# Patient Record
Sex: Male | Born: 1965 | Race: White | Hispanic: No | Marital: Married | Smoking: Never smoker
Health system: Southern US, Community
[De-identification: ages and names within clinical notes are randomized; demographics above are authoritative.]

## PROBLEM LIST (undated history)

## (undated) DIAGNOSIS — J069 Acute upper respiratory infection, unspecified: Secondary | ICD-10-CM

## (undated) DIAGNOSIS — J45901 Unspecified asthma with (acute) exacerbation: Secondary | ICD-10-CM

## (undated) HISTORY — DX: Unspecified asthma with (acute) exacerbation: J45.901

## (undated) HISTORY — DX: Acute upper respiratory infection, unspecified: J06.9

---

## 2007-11-27 ENCOUNTER — Emergency Department (HOSPITAL_COMMUNITY): Admission: EM | Admit: 2007-11-27 | Discharge: 2007-11-27 | Payer: Self-pay | Admitting: Emergency Medicine

## 2011-07-03 LAB — I-STAT 8, (EC8 V) (CONVERTED LAB)
BUN: 6
Bicarbonate: 24.4 — ABNORMAL HIGH
Chloride: 105
Glucose, Bld: 107 — ABNORMAL HIGH
pCO2, Ven: 34.5 — ABNORMAL LOW

## 2011-07-03 LAB — DIFFERENTIAL
Basophils Absolute: 0
Basophils Relative: 0
Eosinophils Absolute: 0.1
Eosinophils Relative: 1
Monocytes Absolute: 1.6 — ABNORMAL HIGH
Monocytes Relative: 12

## 2011-07-03 LAB — CBC
HCT: 43.7
Hemoglobin: 15.2
MCHC: 34.7
MCV: 86.8
RBC: 5.03
RDW: 12.8

## 2012-04-10 ENCOUNTER — Inpatient Hospital Stay (HOSPITAL_COMMUNITY)
Admission: EM | Admit: 2012-04-10 | Discharge: 2012-04-12 | DRG: 226 | Disposition: A | Discharge status: Home / Self Care | Payer: BC Managed Care – PPO | Attending: Orthopedic Surgery | Admitting: Orthopedic Surgery

## 2012-04-10 ENCOUNTER — Emergency Department (HOSPITAL_COMMUNITY): Payer: BC Managed Care – PPO

## 2012-04-10 ENCOUNTER — Encounter (HOSPITAL_COMMUNITY): Payer: Self-pay | Admitting: Emergency Medicine

## 2012-04-10 DIAGNOSIS — W3400XA Accidental discharge from unspecified firearms or gun, initial encounter: Secondary | ICD-10-CM

## 2012-04-10 DIAGNOSIS — D62 Acute posthemorrhagic anemia: Secondary | ICD-10-CM | POA: Diagnosis not present

## 2012-04-10 DIAGNOSIS — S92309B Fracture of unspecified metatarsal bone(s), unspecified foot, initial encounter for open fracture: Secondary | ICD-10-CM | POA: Diagnosis present

## 2012-04-10 DIAGNOSIS — S91309A Unspecified open wound, unspecified foot, initial encounter: Secondary | ICD-10-CM | POA: Diagnosis present

## 2012-04-10 DIAGNOSIS — Z1810 Retained metal fragments, unspecified: Secondary | ICD-10-CM

## 2012-04-10 DIAGNOSIS — S92213B Displaced fracture of cuboid bone of unspecified foot, initial encounter for open fracture: Principal | ICD-10-CM | POA: Diagnosis present

## 2012-04-10 DIAGNOSIS — W3301XA Accidental discharge of shotgun, initial encounter: Secondary | ICD-10-CM

## 2012-04-10 MED ORDER — ONDANSETRON HCL 4 MG/2ML IJ SOLN
4.0000 mg | Freq: Once | INTRAMUSCULAR | Status: AC
  Administered 2012-04-11: 4 mg via INTRAVENOUS
  Filled 2012-04-10: qty 2

## 2012-04-10 MED ORDER — ONDANSETRON 4 MG PO TBDP: 4.0000 mg | ORAL_TABLET | Freq: Once | ORAL | Status: DC

## 2012-04-10 MED ORDER — MORPHINE SULFATE 2 MG/ML IJ SOLN
1.0000 mg | INTRAMUSCULAR | Status: DC | PRN
  Administered 2012-04-11: 1 mg via INTRAVENOUS
  Filled 2012-04-10: qty 1

## 2012-04-10 MED ORDER — CEFAZOLIN SODIUM 1-5 GM-% IV SOLN
1.0000 g | Freq: Three times a day (TID) | INTRAVENOUS | Status: DC
  Administered 2012-04-11: 3 g via INTRAVENOUS
  Administered 2012-04-11 (×2): 1 g via INTRAVENOUS
  Filled 2012-04-10 (×4): qty 50

## 2012-04-10 MED ORDER — FENTANYL CITRATE 0.05 MG/ML IJ SOLN: 100.0000 ug | Freq: Once | INTRAMUSCULAR | Status: DC

## 2012-04-10 MED ORDER — CEFAZOLIN SODIUM 1-5 GM-% IV SOLN: 1.0000 g | Freq: Three times a day (TID) | INTRAVENOUS | Status: DC

## 2012-04-10 MED ORDER — TETANUS-DIPHTH-ACELL PERTUSSIS 5-2.5-18.5 LF-MCG/0.5 IM SUSP
0.5000 mL | Freq: Once | INTRAMUSCULAR | Status: AC
  Administered 2012-04-11: 0.5 mL via INTRAMUSCULAR
  Filled 2012-04-10: qty 0.5

## 2012-04-10 MED ORDER — SODIUM CHLORIDE 0.45 % IV SOLN
INTRAVENOUS | Status: AC
  Administered 2012-04-11: 07:00:00 via INTRAVENOUS

## 2012-04-10 MED ORDER — FENTANYL CITRATE 0.05 MG/ML IJ SOLN
50.0000 ug | Freq: Once | INTRAMUSCULAR | Status: AC
  Administered 2012-04-11: 50 ug via INTRAVENOUS
  Filled 2012-04-10: qty 2

## 2012-04-10 MED ORDER — SODIUM CHLORIDE 0.9 % IV SOLN
INTRAVENOUS | Status: AC
  Administered 2012-04-11 (×2): via INTRAVENOUS
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
## 2012-04-11 ENCOUNTER — Encounter (HOSPITAL_COMMUNITY): Payer: Self-pay | Admitting: Anesthesiology

## 2012-04-11 ENCOUNTER — Encounter (HOSPITAL_COMMUNITY): Payer: Self-pay

## 2012-04-11 ENCOUNTER — Inpatient Hospital Stay (HOSPITAL_COMMUNITY): Payer: BC Managed Care – PPO | Admitting: Anesthesiology

## 2012-04-11 ENCOUNTER — Encounter (HOSPITAL_COMMUNITY)
Admission: EM | Disposition: A | Discharge status: Home / Self Care | Payer: Self-pay | Source: Home / Self Care | Attending: Orthopedic Surgery

## 2012-04-11 HISTORY — PX: I&D EXTREMITY: SHX5045

## 2012-04-11 LAB — BASIC METABOLIC PANEL
BUN: 9 mg/dL (ref 6–23)
CO2: 21 mEq/L (ref 19–32)
GFR calc non Af Amer: 79 mL/min — ABNORMAL LOW (ref >90)
Glucose, Bld: 112 mg/dL — ABNORMAL HIGH (ref 70–99)
Potassium: 3.7 mEq/L (ref 3.5–5.1)
Sodium: 138 mEq/L (ref 135–145)

## 2012-04-11 LAB — CBC WITH DIFFERENTIAL/PLATELET
Eosinophils Absolute: 0.2 10*3/uL (ref 0.0–0.7)
Eosinophils Relative: 2 % (ref 0–5)
Hemoglobin: 15.9 g/dL (ref 13.0–17.0)
Lymphocytes Relative: 19 % (ref 12–46)
Lymphs Abs: 1.8 10*3/uL (ref 0.7–4.0)
MCH: 31.7 pg (ref 26.0–34.0)
MCV: 88.4 fL (ref 78.0–100.0)
Monocytes Relative: 9 % (ref 3–12)
Neutrophils Relative %: 69 % (ref 43–77)
Platelets: 275 10*3/uL (ref 150–400)
RBC: 5.01 MIL/uL (ref 4.22–5.81)
WBC: 9.7 10*3/uL (ref 4.0–10.5)

## 2012-04-11 SURGERY — IRRIGATION AND DEBRIDEMENT EXTREMITY
Anesthesia: General | Site: Foot | Laterality: Right | Wound class: Clean

## 2012-04-11 MED ORDER — LIDOCAINE HCL (CARDIAC) 20 MG/ML IV SOLN
INTRAVENOUS | Status: DC | PRN
  Administered 2012-04-11: 50 mg via INTRAVENOUS

## 2012-04-11 MED ORDER — DEXTROSE 5 % IV SOLN
3.0000 g | INTRAVENOUS | Status: DC
  Filled 2012-04-11: qty 30

## 2012-04-11 MED ORDER — KETOROLAC TROMETHAMINE 30 MG/ML IJ SOLN
15.0000 mg | Freq: Once | INTRAMUSCULAR | Status: AC | PRN
  Administered 2012-04-11: 30 mg via INTRAVENOUS

## 2012-04-11 MED ORDER — FENTANYL CITRATE 0.05 MG/ML IJ SOLN
INTRAMUSCULAR | Status: AC
  Filled 2012-04-11: qty 2

## 2012-04-11 MED ORDER — ACETAMINOPHEN 650 MG RE SUPP: 650.0000 mg | Freq: Four times a day (QID) | RECTAL | Status: DC | PRN

## 2012-04-11 MED ORDER — ACETAMINOPHEN 10 MG/ML IV SOLN
1000.0000 mg | Freq: Four times a day (QID) | INTRAVENOUS | Status: DC
  Filled 2012-04-11 (×2): qty 100

## 2012-04-11 MED ORDER — MIDAZOLAM HCL 5 MG/5ML IJ SOLN
INTRAMUSCULAR | Status: DC | PRN
  Administered 2012-04-11: 2 mg via INTRAVENOUS

## 2012-04-11 MED ORDER — OXYCODONE HCL 5 MG PO TABS: 5.0000 mg | ORAL_TABLET | ORAL | Status: DC | PRN

## 2012-04-11 MED ORDER — LACTATED RINGERS IV SOLN
INTRAVENOUS | Status: DC
  Administered 2012-04-11: 1000 mL via INTRAVENOUS

## 2012-04-11 MED ORDER — SODIUM CHLORIDE 0.9 % IR SOLN
Status: DC | PRN
  Administered 2012-04-11: 1

## 2012-04-11 MED ORDER — METHOCARBAMOL 100 MG/ML IJ SOLN
500.0000 mg | Freq: Four times a day (QID) | INTRAMUSCULAR | Status: DC | PRN
  Filled 2012-04-11: qty 5

## 2012-04-11 MED ORDER — FENTANYL CITRATE 0.05 MG/ML IJ SOLN
INTRAMUSCULAR | Status: DC | PRN
  Administered 2012-04-11 (×4): 25 ug via INTRAVENOUS

## 2012-04-11 MED ORDER — HYDROMORPHONE HCL PF 1 MG/ML IJ SOLN
INTRAMUSCULAR | Status: AC
  Filled 2012-04-11: qty 1

## 2012-04-11 MED ORDER — ACETAMINOPHEN 10 MG/ML IV SOLN: 1000.0000 mg | Freq: Four times a day (QID) | INTRAVENOUS | Status: DC

## 2012-04-11 MED ORDER — SODIUM CHLORIDE 0.9 % IV SOLN
INTRAVENOUS | Status: DC
  Administered 2012-04-12: 02:00:00 via INTRAVENOUS

## 2012-04-11 MED ORDER — CELECOXIB 200 MG PO CAPS
200.0000 mg | ORAL_CAPSULE | Freq: Two times a day (BID) | ORAL | Status: DC
  Administered 2012-04-11 – 2012-04-12 (×2): 200 mg via ORAL
  Filled 2012-04-11 (×3): qty 1

## 2012-04-11 MED ORDER — ACETAMINOPHEN 10 MG/ML IV SOLN
INTRAVENOUS | Status: AC
  Filled 2012-04-11: qty 100

## 2012-04-11 MED ORDER — ONDANSETRON HCL 4 MG PO TABS: 4.0000 mg | ORAL_TABLET | Freq: Four times a day (QID) | ORAL | Status: DC | PRN

## 2012-04-11 MED ORDER — SODIUM CHLORIDE 0.9 % IV SOLN: INTRAVENOUS | Status: DC

## 2012-04-11 MED ORDER — HYDROMORPHONE HCL PF 1 MG/ML IJ SOLN
1.0000 mg | INTRAMUSCULAR | Status: DC | PRN
  Administered 2012-04-11: 1 mg via INTRAVENOUS

## 2012-04-11 MED ORDER — ACETAMINOPHEN 10 MG/ML IV SOLN
1000.0000 mg | Freq: Four times a day (QID) | INTRAVENOUS | Status: AC
  Administered 2012-04-11 – 2012-04-12 (×4): 1000 mg via INTRAVENOUS
  Filled 2012-04-11 (×4): qty 100

## 2012-04-11 MED ORDER — CHLORHEXIDINE GLUCONATE 4 % EX LIQD
60.0000 mL | Freq: Once | CUTANEOUS | Status: DC
  Filled 2012-04-11: qty 60

## 2012-04-11 MED ORDER — ACETAMINOPHEN 325 MG PO TABS: 650.0000 mg | ORAL_TABLET | Freq: Four times a day (QID) | ORAL | Status: DC | PRN

## 2012-04-11 MED ORDER — LORATADINE 10 MG PO TABS
10.0000 mg | ORAL_TABLET | Freq: Every day | ORAL | Status: DC
  Administered 2012-04-12: 10 mg via ORAL
  Filled 2012-04-11: qty 1

## 2012-04-11 MED ORDER — PHENOL 1.4 % MT LIQD
1.0000 | OROMUCOSAL | Status: DC | PRN
  Filled 2012-04-11: qty 177

## 2012-04-11 MED ORDER — HYDROMORPHONE HCL PF 1 MG/ML IJ SOLN
INTRAMUSCULAR | Status: AC
  Administered 2012-04-11: 1 mg via INTRAVENOUS
  Filled 2012-04-11: qty 1

## 2012-04-11 MED ORDER — ZOLPIDEM TARTRATE 5 MG PO TABS: 5.0000 mg | ORAL_TABLET | Freq: Every evening | ORAL | Status: DC | PRN

## 2012-04-11 MED ORDER — ONDANSETRON HCL 4 MG/2ML IJ SOLN: 4.0000 mg | Freq: Four times a day (QID) | INTRAMUSCULAR | Status: DC | PRN

## 2012-04-11 MED ORDER — FENTANYL CITRATE 0.05 MG/ML IJ SOLN
25.0000 ug | INTRAMUSCULAR | Status: DC | PRN
  Administered 2012-04-11 (×2): 50 ug via INTRAVENOUS

## 2012-04-11 MED ORDER — HYDROMORPHONE HCL PF 1 MG/ML IJ SOLN
1.0000 mg | INTRAMUSCULAR | Status: DC | PRN
  Administered 2012-04-11 (×2): 1 mg via INTRAVENOUS
  Filled 2012-04-11: qty 1

## 2012-04-11 MED ORDER — PROPOFOL 10 MG/ML IV BOLUS
INTRAVENOUS | Status: DC | PRN
  Administered 2012-04-11: 200 mg via INTRAVENOUS

## 2012-04-11 MED ORDER — METHOCARBAMOL 500 MG PO TABS: 500.0000 mg | ORAL_TABLET | Freq: Four times a day (QID) | ORAL | Status: DC | PRN

## 2012-04-11 MED ORDER — MENTHOL 3 MG MT LOZG
1.0000 | LOZENGE | OROMUCOSAL | Status: DC | PRN
  Filled 2012-04-11 (×2): qty 9

## 2012-04-11 MED ORDER — KETOROLAC TROMETHAMINE 30 MG/ML IJ SOLN
INTRAMUSCULAR | Status: AC
  Filled 2012-04-11: qty 1

## 2012-04-11 MED ORDER — METOCLOPRAMIDE HCL 10 MG PO TABS: 5.0000 mg | ORAL_TABLET | Freq: Three times a day (TID) | ORAL | Status: DC | PRN

## 2012-04-11 MED ORDER — OXYCODONE HCL 5 MG PO TABS
5.0000 mg | ORAL_TABLET | ORAL | Status: DC | PRN
  Administered 2012-04-12: 5 mg via ORAL
  Filled 2012-04-11 (×3): qty 1

## 2012-04-11 MED ORDER — METOCLOPRAMIDE HCL 5 MG/ML IJ SOLN: 5.0000 mg | Freq: Three times a day (TID) | INTRAMUSCULAR | Status: DC | PRN

## 2012-04-11 MED ORDER — ASPIRIN EC 325 MG PO TBEC
325.0000 mg | DELAYED_RELEASE_TABLET | Freq: Every day | ORAL | Status: DC
  Administered 2012-04-12: 325 mg via ORAL
  Filled 2012-04-11 (×2): qty 1

## 2012-04-11 MED ORDER — PROMETHAZINE HCL 25 MG/ML IJ SOLN: 6.2500 mg | INTRAMUSCULAR | Status: DC | PRN

## 2012-04-11 MED ORDER — CEFAZOLIN SODIUM-DEXTROSE 2-3 GM-% IV SOLR
2.0000 g | Freq: Four times a day (QID) | INTRAVENOUS | Status: AC
  Administered 2012-04-11 – 2012-04-12 (×2): 2 g via INTRAVENOUS
  Filled 2012-04-11 (×2): qty 50

## 2012-04-11 MED ORDER — DIPHENHYDRAMINE HCL 12.5 MG/5ML PO ELIX: 12.5000 mg | ORAL_SOLUTION | ORAL | Status: DC | PRN

## 2012-04-11 SURGICAL SUPPLY — 20 items
BANDAGE ELASTIC 4 VELCRO ST LF (GAUZE/BANDAGES/DRESSINGS) ×2 IMPLANT
BANDAGE GAUZE ELAST BULKY 4 IN (GAUZE/BANDAGES/DRESSINGS) ×4 IMPLANT
CEMENT BONE DEPUY (Cement) ×4 IMPLANT
CLOTH BEACON ORANGE TIMEOUT ST (SAFETY) ×2 IMPLANT
CUFF TOURN SGL QUICK 34 (TOURNIQUET CUFF)
CUFF TRNQT CYL 34X4X40X1 (TOURNIQUET CUFF) IMPLANT
DRAPE C-ARM 42X72 X-RAY (DRAPES) ×2 IMPLANT
DRAPE POUCH INSTRU U-SHP 10X18 (DRAPES) ×2 IMPLANT
DRAPE U-SHAPE 47X51 STRL (DRAPES) ×2 IMPLANT
DURAPREP 26ML APPLICATOR (WOUND CARE) ×2 IMPLANT
ELECT REM PT RETURN 9FT ADLT (ELECTROSURGICAL) ×2
ELECTRODE REM PT RTRN 9FT ADLT (ELECTROSURGICAL) ×1 IMPLANT
GAUZE XEROFORM 5X9 LF (GAUZE/BANDAGES/DRESSINGS) ×2 IMPLANT
GLOVE SURG ORTHO 7.0 STRL STRW (GLOVE) IMPLANT
GLOVE SURG ORTHO 8.0 STRL STRW (GLOVE) ×4 IMPLANT
MANIFOLD NEPTUNE II (INSTRUMENTS) ×2 IMPLANT
NS IRRIG 1000ML POUR BTL (IV SOLUTION) ×2 IMPLANT
POSITIONER SURGICAL ARM (MISCELLANEOUS) ×2 IMPLANT
SUCTION FRAZIER 12FR DISP (SUCTIONS) ×2 IMPLANT
TRAY FOLEY CATH 14FRSI W/METER (CATHETERS) ×2 IMPLANT

## 2012-04-11 NOTE — Anesthesia Postprocedure Evaluation (Signed)
  Anesthesia Post-op Note  Patient: Christopher Medina  Procedure(s) Performed: Procedure(s) (LRB): IRRIGATION AND DEBRIDEMENT EXTREMITY (Right)  Patient Location: PACU  Anesthesia Type: General  Level of Consciousness: awake and alert   Airway and Oxygen Therapy: Patient Spontanous Breathing  Post-op Pain: mild  Post-op Assessment: Post-op Vital signs reviewed, Patient's Cardiovascular Status Stable, Respiratory Function Stable, Patent Airway and No signs of Nausea or vomiting  Post-op Vital Signs: stable  Complications: No apparent anesthesia complications
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
## 2012-04-11 NOTE — Transfer of Care (Signed)
Immediate Anesthesia Transfer of Care Note  Patient: Christopher Medina  Procedure(s) Performed: Procedure(s) (LRB): IRRIGATION AND DEBRIDEMENT EXTREMITY (Right)  Patient Location: PACU  Anesthesia Type: General  Level of Consciousness: sedated, patient cooperative and responds to stimulaton  Airway & Oxygen Therapy: Patient Spontanous Breathing and Patient connected to face mask oxgen  Post-op Assessment: Report given to PACU RN and Post -op Vital signs reviewed and stable  Post vital signs: Reviewed and stable  Complications: No apparent anesthesia complications
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
## 2012-04-11 NOTE — Anesthesia Preprocedure Evaluation (Addendum)
Anesthesia Evaluation  Patient identified by MRN, date of birth, ID band Patient awake    Reviewed: Allergy & Precautions, H&P , NPO status , Patient's Chart, lab work & pertinent test results  Airway Mallampati: II TM Distance: <3 FB Neck ROM: Full    Dental No notable dental hx.    Pulmonary neg pulmonary ROS,  breath sounds clear to auscultation  Pulmonary exam normal       Cardiovascular negative cardio ROS  Rhythm:Regular Rate:Normal     Neuro/Psych negative neurological ROS  negative psych ROS   GI/Hepatic negative GI ROS, Neg liver ROS,   Endo/Other  negative endocrine ROS  Renal/GU negative Renal ROS  negative genitourinary   Musculoskeletal negative musculoskeletal ROS (+)   Abdominal   Peds negative pediatric ROS (+)  Hematology negative hematology ROS (+)   Anesthesia Other Findings   Reproductive/Obstetrics negative OB ROS                           Anesthesia Physical Anesthesia Plan  ASA: I  Anesthesia Plan: General   Post-op Pain Management:    Induction: Intravenous  Airway Management Planned: LMA  Additional Equipment:   Intra-op Plan:   Post-operative Plan:   Informed Consent: I have reviewed the patients History and Physical, chart, labs and discussed the procedure including the risks, benefits and alternatives for the proposed anesthesia with the patient or authorized representative who has indicated his/her understanding and acceptance.   Dental advisory given  Plan Discussed with: CRNA  Anesthesia Plan Comments:         Anesthesia Quick Evaluation  

## 2012-04-11 NOTE — Progress Notes (Signed)
Christopher Cabal, PA notified pt's pain not controlled with 1mg  IV Morphine. New orders for IV Dilaudid and PO Oxy IR.

## 2012-04-11 NOTE — ED Provider Notes (Signed)
Medical screening examination/treatment/procedure(s) were conducted as a shared visit with non-physician practitioner(s) and myself.  I personally evaluated the patient during the encounter.  Please see completed note for this encounter.  Raeford Razor, MD 04/11/12 0040

## 2012-04-12 LAB — CBC
Hemoglobin: 13.8 g/dL (ref 13.0–17.0)
MCH: 30.9 pg (ref 26.0–34.0)
MCV: 91.1 fL (ref 78.0–100.0)
RBC: 4.47 MIL/uL (ref 4.22–5.81)

## 2012-04-12 LAB — BASIC METABOLIC PANEL
CO2: 26 mEq/L (ref 19–32)
Calcium: 9 mg/dL (ref 8.4–10.5)
Creatinine, Ser: 0.81 mg/dL (ref 0.50–1.35)
Glucose, Bld: 106 mg/dL — ABNORMAL HIGH (ref 70–99)

## 2012-04-12 MED ORDER — OXYCODONE HCL 5 MG PO TABS: 5.0000 mg | ORAL_TABLET | ORAL | Status: AC | PRN

## 2012-04-12 MED ORDER — CIPROFLOXACIN HCL 500 MG PO TABS: 500.0000 mg | ORAL_TABLET | Freq: Two times a day (BID) | ORAL | Status: AC
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
## 2012-04-12 NOTE — Progress Notes (Signed)
UR completed 

## 2012-04-12 NOTE — Progress Notes (Signed)
Christopher Spurling, MD   Christopher Cabal, PA-C [address] [place], [place], [place]  [address]                             [phone]   PROGRESS NOTE  Subjective:  negative for Chest Pain  negative for Shortness of Breath  negative for Nausea/Vomiting   negative for Calf Pain  negative for Bowel Movement   Tolerating Diet: yes         Patient reports pain as 6 on 0-10 scale.    Objective: Vital signs in last 24 hours:   Patient Vitals for the past 24 hrs:  BP Temp Temp src Pulse Resp SpO2  04/12/12 0548 129/76 mmHg 97.5 F (36.4 C) Oral 75  16  100 %  04/12/12 0400 - - - - 16  97 %  04/12/12 0202 120/82 mmHg 97.8 F (36.6 C) Oral 70  16  100 %  04/12/12 0029 - - - 70  - 97 %  04/12/12 0000 - - - - 16  97 %  04/11/12 2136 120/78 mmHg 98.3 F (36.8 C) Axillary 89  16  97 %  04/11/12 2012 135/83 mmHg 97.6 F (36.4 C) Oral 64  16  99 %  04/11/12 1928 - - - - 16  99 %  04/11/12 1920 133/81 mmHg 97 F (36.1 C) - 68  14  99 %  04/11/12 1810 130/87 mmHg 97.7 F (36.5 C) - 69  14  98 %  04/11/12 1800 120/84 mmHg - - 73  14  100 %  04/11/12 1745 135/91 mmHg - - 68  13  99 %  04/11/12 1730 124/100 mmHg 98.2 F (36.8 C) - 70  12  98 %  04/11/12 1725 - - - 70  13  100 %  04/11/12 1720 - - - 70  11  100 %  04/11/12 1715 142/97 mmHg - - 70  13  100 %  04/11/12 1710 142/91 mmHg - - 80  13  100 %  04/11/12 1705 - - - 84  21  100 %  04/11/12 1700 133/100 mmHg 98.7 F (37.1 C) - - - -  04/11/12 1359 146/91 mmHg 99.1 F (37.3 C) Oral 83  20  98 %    @flow {1959:LAST@   Intake/Output from previous day:   07/01 0701 - 07/02 0700 In: 2860 [P.O.:960; I.V.:1800] Out: 1625 [Urine:1625]   Intake/Output this shift:   07/02 0701 - 07/02 1900 In: 835 [P.O.:360; I.V.:475] Out: 400 [Urine:400]   Intake/Output      07/01 0701 - 07/02 0700 07/02 0701 - 07/03 0700   P.O. 960 360   I.V. (mL/kg) 1800 (16.5) 475 (4.4)   IV Piggyback 100    Total Intake(mL/kg) 2860 (26.3) 835 (7.7)   Urine  (mL/kg/hr) 1625 (0.6) 400 (0.7)   Total Output 1625 400   Net +1235 +435        Urine Occurrence 3 x       LABORATORY DATA:  Basename 04/12/12 0350 04/11/12 0015  WBC 9.0 9.7  HGB 13.8 15.9  HCT 40.7 44.3  PLT 217 275    Basename 04/12/12 0350 04/11/12 0015  NA 136 138  K 3.7 3.7  CL 102 104  CO2 26 21  BUN 7 9  CREATININE 0.81 1.10  GLUCOSE 106* 112*  CALCIUM 9.0 9.4   No results found for this basename: INR, PROTIME  Examination:  General appearance: alert, cooperative and no distress Extremities: extremities normal, atraumatic, no cyanosis or edema and Homans sign is negative, no sign of DVT  Wound Exam: clean, dry, intact   Drainage:  Scant/small amount Serosanguinous exudate  Motor Exam: EHL and FHL Intact  Sensory Exam: Deep Peroneal normal  Vascular Exam:    Assessment:    1 Day Post-Op  Procedure(s) (LRB): IRRIGATION AND DEBRIDEMENT EXTREMITY (Right)  ADDITIONAL DIAGNOSIS:  Active Problems:  * No active hospital problems. *   Acute Blood Loss Anemia   Plan: Physical Therapy as ordered touch down weightbearing  DVT Prophylaxis:  Aspirin  DISCHARGE PLAN: Home  DISCHARGE NEEDS: crutches         Christopher Medina 04/12/2012, 12:16 PM

## 2012-04-12 NOTE — Op Note (Signed)
Dictation [phone]
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
## 2012-04-18 ENCOUNTER — Encounter (HOSPITAL_COMMUNITY): Payer: Self-pay | Admitting: Orthopedic Surgery

## 2015-03-30 ENCOUNTER — Emergency Department (HOSPITAL_COMMUNITY)
Admission: EM | Admit: 2015-03-30 | Discharge: 2015-03-30 | Disposition: A | Discharge status: Home / Self Care | Payer: BC Managed Care – PPO | Attending: Emergency Medicine | Admitting: Emergency Medicine

## 2015-03-30 ENCOUNTER — Encounter (HOSPITAL_COMMUNITY): Payer: Self-pay | Admitting: Emergency Medicine

## 2015-03-30 DIAGNOSIS — Z87891 Personal history of nicotine dependence: Secondary | ICD-10-CM | POA: Insufficient documentation

## 2015-03-30 DIAGNOSIS — F419 Anxiety disorder, unspecified: Secondary | ICD-10-CM | POA: Diagnosis not present

## 2015-03-30 DIAGNOSIS — F439 Reaction to severe stress, unspecified: Secondary | ICD-10-CM | POA: Insufficient documentation

## 2015-03-30 DIAGNOSIS — Z79899 Other long term (current) drug therapy: Secondary | ICD-10-CM | POA: Diagnosis not present

## 2015-03-30 DIAGNOSIS — R42 Dizziness and giddiness: Secondary | ICD-10-CM | POA: Diagnosis present

## 2015-03-30 DIAGNOSIS — I10 Essential (primary) hypertension: Secondary | ICD-10-CM | POA: Insufficient documentation

## 2015-03-30 LAB — CBC
HEMATOCRIT: 46.3 % (ref 39.0–52.0)
HEMOGLOBIN: 15.8 g/dL (ref 13.0–17.0)
MCH: 28.8 pg (ref 26.0–34.0)
MCHC: 34.1 g/dL (ref 30.0–36.0)
MCV: 84.5 fL (ref 78.0–100.0)
PLATELETS: 301 10*3/uL (ref 150–400)
RBC: 5.48 MIL/uL (ref 4.22–5.81)
RDW: 13.2 % (ref 11.5–15.5)
WBC: 11.2 10*3/uL — AB (ref 4.0–10.5)

## 2015-03-30 LAB — BASIC METABOLIC PANEL
Anion gap: 8 (ref 5–15)
BUN: 12 mg/dL (ref 6–20)
CHLORIDE: 103 mmol/L (ref 101–111)
CO2: 24 mmol/L (ref 22–32)
Calcium: 9.3 mg/dL (ref 8.9–10.3)
Creatinine, Ser: 0.91 mg/dL (ref 0.61–1.24)
GFR calc non Af Amer: >60 mL/min (ref >60)
GLUCOSE: 107 mg/dL — AB (ref 65–99)
POTASSIUM: 3.6 mmol/L (ref 3.5–5.1)
Sodium: 135 mmol/L (ref 135–145)

## 2015-03-30 LAB — CBG MONITORING, ED: GLUCOSE-CAPILLARY: 99 mg/dL (ref 65–99)

## 2015-03-30 MED ORDER — SODIUM CHLORIDE 0.9 % IV SOLN
1000.0000 mL | INTRAVENOUS | Status: DC
  Administered 2015-03-30: 1000 mL via INTRAVENOUS

## 2015-03-30 MED ORDER — SODIUM CHLORIDE 0.9 % IV SOLN
1000.0000 mL | Freq: Once | INTRAVENOUS | Status: AC
  Administered 2015-03-30: 1000 mL via INTRAVENOUS

## 2015-03-30 NOTE — ED Provider Notes (Signed)
CSN: [id number]     Arrival date & time 03/30/15  1558 History   First MD Initiated Contact with Patient 03/30/15 1602     Chief Complaint  Patient presents with  . 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  The patient stopped eat lunch when he started to feel  lightheaded. He called his wife and they took him to the local fire station where they measured his blood pressure. It was elevated at 200/112. Patient rested at the fire station and his blood pressure started to improve. Suggested he come to the emergency room for evaluation. Patient states as time has progressed his symptoms have resolved. He feels much better now.Marland Kitchen He was not having any room spinning sensation. He denied any trouble with his balance or coordination. No trouble with his speech. No focal numbness or weakness.  Pt denies history of high blood pressure.  He has not had a routine check up in a while but has been seen at urgent cares for cold symptoms etc and has not been told he has high blood pressure.  He does admit to feeling stress when he is at his mother in laws house and is not sure if that is a contributing factor. History reviewed. No pertinent past medical history. Past Surgical History  Procedure Laterality Date  . I&d extremity  04/11/2012    Procedure: IRRIGATION AND DEBRIDEMENT EXTREMITY;  Surgeon: Raymon Mutton, MD;  Location: [facility] ORS;  Service: Orthopedics;  Laterality: Right;   No family history on file. History  Substance Use Topics  . Smoking status: Former Games developer  . Smokeless tobacco: Not on file  . Alcohol Use: No    Review of Systems  All other systems reviewed and are negative.     Allergies  Review of patient's allergies indicates no known allergies.  Home Medications   Prior to Admission medications   Medication Sig Start Date End Date Taking? Authorizing Provider  dextromethorphan 15 MG/5ML syrup Take 10 mLs by mouth 4 (four) times  daily as needed for cough.   Yes Historical Provider, MD  fexofenadine (ALLEGRA) 180 MG tablet Take 180 mg by mouth daily.   Yes Historical Provider, MD   BP 139/97 mmHg  Pulse 70  Temp(Src) 98.4 F (36.9 C) (Oral)  Resp 10  SpO2 99% Physical Exam  Constitutional: He appears well-developed and well-nourished. No distress.  HENT:  Head: Normocephalic and atraumatic.  Right Ear: External ear normal.  Left Ear: External ear normal.  Eyes: Conjunctivae are normal. Right eye exhibits no discharge. Left eye exhibits no discharge. No scleral icterus.  Neck: Neck supple. No tracheal deviation present.  Cardiovascular: Normal rate, regular rhythm and intact distal pulses.   Pulmonary/Chest: Effort normal and breath sounds normal. No stridor. No respiratory distress. He has no wheezes. He has no rales.  Abdominal: Soft. Bowel sounds are normal. He exhibits no distension. There is no tenderness. There is no rebound and no guarding.  Musculoskeletal: He exhibits no edema or tenderness.  Neurological: He is alert. He has normal strength. No cranial nerve deficit (no facial droop, extraocular movements intact, no slurred speech) or sensory deficit. He exhibits normal muscle tone. He displays no seizure activity. Coordination normal.  Skin: Skin is warm and dry. No rash noted.  Psychiatric: He has a normal mood and affect.  Nursing note and vitals reviewed.   ED Course  Procedures (including critical care time) Labs Review Labs Reviewed  CBC - Abnormal; Notable for the following:    WBC 11.2 (*)    All other components within normal limits  BASIC METABOLIC PANEL - Abnormal; Notable for the following:    Glucose, Bld 107 (*)    All other components within normal limits  CBG MONITORING, ED     EKG Interpretation   Date/Time:  Saturday March 30 2015 16:09:45 EDT Ventricular Rate:  99 PR Interval:  160 QRS Duration: 104 QT Interval:  348 QTC Calculation: 447 R Axis:   21 Text  Interpretation:  Sinus rhythm Probable left atrial enlargement Low  voltage, precordial leads Anteroseptal infarct, age indeterminate Baseline  wander in lead(s) V5 No old tracing to compare Confirmed by Galilea Quito  MD-J,  Ashaunte Standley ([id number]) on 03/30/2015 4:34:03 PM      MDM   Final diagnoses:  Essential hypertension  Stress   Pt' symptoms resolved in the ED.  He has been having a lot of stress and anxiety associated with his mother in law.  This is the most likely reason for his symptoms.  Blood pressure is mildly elevated though. Discussed routine outpatient follow up with a PCP    Linwood Dibbles, MD 03/30/15 [phone]

## 2015-03-30 NOTE — ED Notes (Signed)
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  Pt states that during lunch, he started feeling dizzy.  Pt states that he drove to his local fire station where they told him his blood pressure was 200/112.  States that he stayed there for about 30 minutes where his BP started to normalize.  Pt states he feels fine now but was instructed by fire to come here to the ER to get checked out.

## 2015-03-30 NOTE — Discharge Instructions (Signed)

## 2016-08-17 ENCOUNTER — Encounter: Payer: Self-pay | Admitting: Allergy and Immunology

## 2016-08-17 ENCOUNTER — Encounter (INDEPENDENT_AMBULATORY_CARE_PROVIDER_SITE_OTHER): Payer: Self-pay

## 2016-08-17 ENCOUNTER — Ambulatory Visit (INDEPENDENT_AMBULATORY_CARE_PROVIDER_SITE_OTHER): Payer: BC Managed Care – PPO | Admitting: Allergy and Immunology

## 2016-08-17 VITALS — BP 130/100 | HR 74 | Temp 97.5°F | Resp 12 | Ht 73.23 in | Wt 242.1 lb

## 2016-08-17 DIAGNOSIS — R030 Elevated blood-pressure reading, without diagnosis of hypertension: Secondary | ICD-10-CM | POA: Diagnosis not present

## 2016-08-17 DIAGNOSIS — R062 Wheezing: Secondary | ICD-10-CM

## 2016-08-17 DIAGNOSIS — H1013 Acute atopic conjunctivitis, bilateral: Secondary | ICD-10-CM

## 2016-08-17 DIAGNOSIS — J3089 Other allergic rhinitis: Secondary | ICD-10-CM

## 2016-08-17 DIAGNOSIS — H1010 Acute atopic conjunctivitis, unspecified eye: Secondary | ICD-10-CM | POA: Insufficient documentation

## 2016-08-17 DIAGNOSIS — J320 Chronic maxillary sinusitis: Secondary | ICD-10-CM

## 2016-08-17 DIAGNOSIS — J329 Chronic sinusitis, unspecified: Secondary | ICD-10-CM | POA: Insufficient documentation

## 2016-08-17 DIAGNOSIS — J45901 Unspecified asthma with (acute) exacerbation: Secondary | ICD-10-CM | POA: Insufficient documentation

## 2016-08-17 LAB — CBC WITH DIFFERENTIAL/PLATELET
Basophils Absolute: 0 cells/uL (ref 0–200)
Basophils Relative: 0 %
Eosinophils Absolute: 180 cells/uL (ref 15–500)
Eosinophils Relative: 2 %
HCT: 46.3 % (ref 38.5–50.0)
Hemoglobin: 15.7 g/dL (ref 13.2–17.1)
Lymphocytes Relative: 19 %
Lymphs Abs: 1710 cells/uL (ref 850–3900)
MCH: 29.7 pg (ref 27.0–33.0)
MCHC: 33.9 g/dL (ref 32.0–36.0)
MCV: 87.7 fL (ref 80.0–100.0)
MPV: 9.7 fL (ref 7.5–12.5)
Monocytes Absolute: 630 cells/uL (ref 200–950)
Monocytes Relative: 7 %
Neutro Abs: 6480 cells/uL (ref 1500–7800)
Neutrophils Relative %: 72 %
Platelets: 327 10*3/uL (ref 140–400)
RBC: 5.28 MIL/uL (ref 4.20–5.80)
RDW: 13.2 % (ref 11.0–15.0)
WBC: 9 10*3/uL (ref 3.8–10.8)

## 2016-08-17 MED ORDER — OLOPATADINE HCL 0.7 % OP SOLN: 1.0000 [drp] | OPHTHALMIC | 5 refills | Status: DC

## 2016-08-17 MED ORDER — EPINEPHRINE 0.3 MG/0.3ML IJ SOAJ: INTRAMUSCULAR | 3 refills | Status: DC

## 2016-08-17 MED ORDER — ALBUTEROL SULFATE 108 (90 BASE) MCG/ACT IN AEPB: 2.0000 | INHALATION_SPRAY | RESPIRATORY_TRACT | 2 refills | Status: DC | PRN

## 2016-08-17 MED ORDER — EPINEPHRINE 0.3 MG/0.3ML IJ SOAJ: 0.3000 mg | Freq: Once | INTRAMUSCULAR | 2 refills | Status: AC

## 2016-08-17 MED ORDER — AZELASTINE-FLUTICASONE 137-50 MCG/ACT NA SUSP: 1.0000 | Freq: Two times a day (BID) | NASAL | 5 refills | Status: DC | PRN

## 2016-08-17 NOTE — Assessment & Plan Note (Signed)
Spirometry was normal today.  A prescription has been provided for ProAir Respiclick, 1-2 inhalations every 4-6 hours as needed.  Subjective and objective measures of pulmonary function will be followed and the treatment plan will be adjusted accordingly. 

## 2016-08-17 NOTE — Assessment & Plan Note (Deleted)
   The patient has been made aware of the elevated blood pressure reading and has been encouraged to follow up with his primary care physician in the near future regarding this issue.  Christopher Medina has verbalized understanding and agreed to do so.  Decongestants, such as pseudoephedrine and phenylephrine, will be avoided due to elevated blood pressure readings today. 

## 2016-08-17 NOTE — Assessment & Plan Note (Signed)
   Treatment plan as outlined above for allergic rhinitis.  A prescription has been provided for Pazeo, one drop per eye daily as needed. 

## 2016-08-17 NOTE — Assessment & Plan Note (Signed)
Christopher Medina's recurrent sinusitis may be due to perennial and seasonal allergic rhinitis, however to be thorough, we will check screening labs to assess immunocompetence.  The following labs have been ordered: CBC with differential, IgG, IgA and IgM as well as tetanus IgG and pneumococcal IgG titers. If pre-vaccination titers are lowost-vaccination titers will be drawn to assess response.    The patient will be called with further recommendations and follow-up instructions once the labs have returned.

## 2016-08-17 NOTE — Assessment & Plan Note (Signed)
   The patient has been made aware of the elevated blood pressure reading and has been encouraged to follow up with his primary care physician in the near future regarding this issue.  Christopher Medina has verbalized understanding and agreed to do so.  Decongestants, such as pseudoephedrine and phenylephrine, will be avoided due to elevated blood pressure readings today.

## 2016-08-17 NOTE — Assessment & Plan Note (Deleted)
Spirometry was normal today.  A prescription has been provided for ProAir Respiclick, 1-2 inhalations every 4-6 hours as needed.  Subjective and objective measures of pulmonary function will be followed and the treatment plan will be adjusted accordingly.

## 2016-08-17 NOTE — Assessment & Plan Note (Addendum)
   Aeroallergen avoidance measures have been discussed and provided in written form.  A prescription has been provided for Dymista (azelastine/fluticasone) nasal spray, 1 spray per nostril twice daily as needed. Proper nasal spray technique has been discussed and demonstrated.  Nasal saline lavage (NeilMed) as needed has been recommended prior to medicated nasal sprays and as needed along with instructions for proper administration.  For thick post nasal drainage, add guaifenesin 1200 mg (Mucinex Maximum Strength)  twice daily as needed with adequate hydration as discussed.   Decongestants, such as pseudoephedrine and phenylephrine, will be avoided due to elevated blood pressure readings today. The risks and benefits of aeroallergen immunotherapy have been discussed. The patient is motivated to initiate immunotherapy if insurance coverage is favorable. He will let us know how he would like to proceed.

## 2016-08-17 NOTE — Progress Notes (Signed)
New Patient Note  RE: Christopher Medina MRN: [id number] DOB: May 28, 1966 Date of Office Visit: 08/17/2016  Referring provider: No ref. provider found Primary care provider: Philis Nettle, FNP  Chief Complaint: Sinusitis; Allergic Rhinitis ; and Wheezing   History of present illness: Christopher Medina is a 50 y.o. male seen today in consultation requested by Philis Nettle, FNP.  He reports that over the past 4 years he has been experiencing recurrent sinusitis.  Despite compliance with nasal saline irrigation, he has had 3 or 4 sinus infections requiring antibiotics over the past 4 years.  On 3 occasions the sinus infection has progressed to bronchitis with symptoms consisting of coughing, chest tightness, and wheezing.  Preceding the sinus infections he experiences nasal congestion, postnasal drainage, and sinus pressure.  The sinus infections occur approximately every 3-4 months without significant seasonal variation.  He was recently prescribed a nasal spray (unspecified) though he did not use it long enough to determine benefit.  Over the past several years he has been doing more yard work and hay bailing.  He is a Chartered certified accountant by trade.  He had a recent chest x-ray which was negative.  Over the past several years he has also been troubled by progressively increasing heartburn, waterbrash, and coughing.  His symptoms associated with acid reflux improved significantly after he was prescribed omeprazole.   Assessment and plan: Perennial and seasonal allergic rhinitis  Aeroallergen avoidance measures have been discussed and provided in written form.  A prescription has been provided for Dymista (azelastine/fluticasone) nasal spray, 1 spray per nostril twice daily as needed. Proper nasal spray technique has been discussed and demonstrated.  Nasal saline lavage (NeilMed) as needed has been recommended prior to medicated nasal sprays and as needed along with instructions for proper administration.  For  thick post nasal drainage, add guaifenesin 1200 mg (Mucinex Maximum Strength)  twice daily as needed with adequate hydration as discussed.   Decongestants, such as pseudoephedrine and phenylephrine, will be avoided due to elevated blood pressure readings today. The risks and benefits of aeroallergen immunotherapy have been discussed. The patient is motivated to initiate immunotherapy if insurance coverage is favorable. He will let us know how he would like to proceed.  Recurrent sinusitis Lenus's recurrent sinusitis may be due to perennial and seasonal allergic rhinitis, however to be thorough, we will check screening labs to assess immunocompetence.  The following labs have been ordered: CBC with differential, IgG, IgA and IgM as well as tetanus IgG and pneumococcal IgG titers. If pre-vaccination titers are lowost-vaccination titers will be drawn to assess response.    The patient will be called with further recommendations and follow-up instructions once the labs have returned.  Allergic conjunctivitis  Treatment plan as outlined above for allergic rhinitis.  A prescription has been provided for Pazeo, one drop per eye daily as needed.  Bronchitis/wheezing Spirometry was normal today.  A prescription has been provided for ProAir Respiclick, 1-2 inhalations every 4-6 hours as needed.  Subjective and objective measures of pulmonary function will be followed and the treatment plan will be adjusted accordingly.  Elevated blood-pressure reading without diagnosis of hypertension  The patient has been made aware of the elevated blood pressure reading and has been encouraged to follow up with his primary care physician in the near future regarding this issue.  Christopher Medina has verbalized understanding and agreed to do so.  Decongestants, such as pseudoephedrine and phenylephrine, will be avoided due to elevated blood pressure readings today.   Meds ordered  this encounter  Medications  .  EPINEPHrine (EPIPEN 2-PAK) 0.3 mg/0.3 mL IJ SOAJ injection    Sig: Inject 0.3 mLs (0.3 mg total) into the muscle once.    Dispense:  2 Device    Refill:  2  . Azelastine-Fluticasone (DYMISTA) 137-50 MCG/ACT SUSP    Sig: Place 1 spray into the nose 2 (two) times daily as needed.    Dispense:  1 Bottle    Refill:  5  . Albuterol Sulfate (PROAIR RESPICLICK) 108 (90 Base) MCG/ACT AEPB    Sig: Inhale 2 puffs into the lungs every 4 (four) hours as needed.    Dispense:  1 each    Refill:  2  . EPINEPHrine (AUVI-Q) 0.3 mg/0.3 mL IJ SOAJ injection    Sig: Use as directed for sever allergic reactions    Dispense:  1 Device    Refill:  3    Please call patient on this # [phone]  . Olopatadine HCl (PAZEO) 0.7 % SOLN    Sig: Place 1 drop into both eyes 1 day or 1 dose.    Dispense:  1 Bottle    Refill:  5    Diagnostics: Spirometry: FVC was 4.90 L and FEV1 was 4.26 L without significant post-bronchodilator improvement.  Please see scanned spirometry results for details. Allergy skin testing: Positive to grass pollen, weed pollen, ragweed pollen, tree pollen, mold, rabbit epithelia, dust mite, and cockroach antigen.    Physical examination: Blood pressure (!) 130/100, pulse 74, temperature 97.5 F (36.4 C), temperature source Oral, resp. rate 12, height 6' 1.23" (1.86 m), weight 242 lb 1 oz (109.8 kg), SpO2 97 %.  General: Alert, interactive, in no acute distress. HEENT: TMs pearly gray, turbinates moderately edematous with thick discharge, post-pharynx erythematous. Neck: Supple without lymphadenopathy. Lungs: Clear to auscultation without wheezing, rhonchi or rales. CV: Normal S1, S2 without murmurs. Abdomen: Nondistended, nontender. Skin: Warm and dry, without lesions or rashes. Extremities:  No clubbing, cyanosis or edema. Neuro:   Grossly intact.  Review of systems:  Review of systems negative except as noted in HPI / PMHx or noted below: Review of Systems  Constitutional:  Negative.   HENT: Negative.   Eyes: Negative.   Respiratory: Negative.   Cardiovascular: Negative.   Gastrointestinal: Negative.   Genitourinary: Negative.   Musculoskeletal: Negative.   Skin: Negative.   Neurological: Negative.   Endo/Heme/Allergies: Negative.   Psychiatric/Behavioral: Negative.     Past medical history:   Recurrent sinusitis. Bronchitis.   Past surgical history:  Past Surgical History:  Procedure Laterality Date  . I&D EXTREMITY  04/11/2012   Procedure: IRRIGATION AND DEBRIDEMENT EXTREMITY;  Surgeon: Raymon MuttonStephen D Lucey, MD;  Location: [facility];  Service: Orthopedics;  Laterality: Right;    Family history: Family History  Problem Relation Age of Onset  . Allergic rhinitis Mother   . Allergic rhinitis Sister   . Angioedema Paternal Grandmother     Social history: Social History   Social History  . Marital status: Married    Spouse name: N/A  . Number of children: N/A  . Years of education: N/A   Occupational History  . Not on file.   Social History Main Topics  . Smoking status: Never Smoker  . Smokeless tobacco: Never Used  . Alcohol use No  . Drug use: No  . Sexual activity: Not on file   Other Topics Concern  . Not on file   Social History Narrative  . No narrative on file  Environmental History:  The patient lives in a 51 year old house with hardwood floors throughout, gas heat, and central air.  There is a rabbit in the house which does not have access to his bedroom.  He is a nonsmoker.    Medication List       Accurate as of 08/17/16  4:08 PM. Always use your most recent med list.          Albuterol Sulfate 108 (90 Base) MCG/ACT Aepb Commonly known as:  PROAIR RESPICLICK Inhale 2 puffs into the lungs every 4 (four) hours as needed.   Azelastine-Fluticasone 137-50 MCG/ACT Susp Commonly known as:  DYMISTA Place 1 spray into the nose 2 (two) times daily as needed.   cetirizine 10 MG tablet Commonly known as:  ZYRTEC Take 10  mg by mouth daily.   EPINEPHrine 0.3 mg/0.3 mL Soaj injection Commonly known as:  EPIPEN 2-PAK Inject 0.3 mLs (0.3 mg total) into the muscle once.   EPINEPHrine 0.3 mg/0.3 mL Soaj injection Commonly known as:  AUVI-Q Use as directed for sever allergic reactions   ibuprofen 200 MG tablet Commonly known as:  ADVIL,MOTRIN Take 200 mg by mouth every 6 (six) hours as needed.   mometasone 50 MCG/ACT nasal spray Commonly known as:  NASONEX Place 2 sprays into the nose daily.   montelukast 10 MG tablet Commonly known as:  SINGULAIR Take 10 mg by mouth at bedtime.   Olopatadine HCl 0.7 % Soln Commonly known as:  PAZEO Place 1 drop into both eyes 1 day or 1 dose.   omeprazole 40 MG capsule Commonly known as:  PRILOSEC Take 40 mg by mouth daily.       Known medication allergies: No Known Allergies  I appreciate the opportunity to take part in Mirza's care. Please do not hesitate to contact me with questions.  Sincerely,   R. Jorene Guestarter Masha Orbach, MD

## 2016-08-17 NOTE — Patient Instructions (Addendum)
Perennial and seasonal allergic rhinitis  Aeroallergen avoidance measures have been discussed and provided in written form.  A prescription has been provided for Dymista (azelastine/fluticasone) nasal spray, 1 spray per nostril twice daily as needed. Proper nasal spray technique has been discussed and demonstrated.  Nasal saline lavage (NeilMed) as needed has been recommended prior to medicated nasal sprays and as needed along with instructions for proper administration.  For thick post nasal drainage, add guaifenesin 1200 mg (Mucinex Maximum Strength)  twice daily as needed with adequate hydration as discussed.   Decongestants, such as pseudoephedrine and phenylephrine, will be avoided due to elevated blood pressure readings today. The risks and benefits of aeroallergen immunotherapy have been discussed. The patient is motivated to initiate immunotherapy if insurance coverage is favorable. He will let us know how he would like to proceed.  Recurrent sinusitis Travin's recurrent sinusitis may be due to perennial and seasonal allergic rhinitis, however to be thorough, we will check screening labs to assess immunocompetence.  The following labs have been ordered: CBC with differential, IgG, IgA and IgM as well as tetanus IgG and pneumococcal IgG titers. If pre-vaccination titers are lowost-vaccination titers will be drawn to assess response.    The patient will be called with further recommendations and follow-up instructions once the labs have returned.  Allergic conjunctivitis  Treatment plan as outlined above for allergic rhinitis.  A prescription has been provided for Pazeo, one drop per eye daily as needed.  Bronchitis/wheezing Spirometry was normal today.  A prescription has been provided for ProAir Respiclick, 1-2 inhalations every 4-6 hours as needed.  Subjective and objective measures of pulmonary function will be followed and the treatment plan will be adjusted  accordingly.  Elevated blood-pressure reading without diagnosis of hypertension  The patient has been made aware of the elevated blood pressure reading and has been encouraged to follow up with his primary care physician in the near future regarding this issue.  Zamier has verbalized understanding and agreed to do so.  Decongestants, such as pseudoephedrine and phenylephrine, will be avoided due to elevated blood pressure readings today.   Return in about 2 months (around 10/17/2016), or if symptoms worsen or fail to improve.  Control of House Dust Mite Allergen  House dust mites play a major role in allergic asthma and rhinitis.  They occur in environments with high humidity wherever human skin, the food for dust mites is found. High levels have been detected in dust obtained from mattresses, pillows, carpets, upholstered furniture, bed covers, clothes and soft toys.  The principal allergen of the house dust mite is found in its feces.  A gram of dust may contain 1,000 mites and 250,000 fecal particles.  Mite antigen is easily measured in the air during house cleaning activities.    1. Encase mattresses, including the box spring, and pillow, in an air tight cover.  Seal the zipper end of the encased mattresses with wide adhesive tape. 2. Wash the bedding in water of 130 degrees Farenheit weekly.  Avoid cotton comforters/quilts and flannel bedding: the most ideal bed covering is the dacron comforter. 3. Remove all upholstered furniture from the bedroom. 4. Remove carpets, carpet padding, rugs, and non-washable window drapes from the bedroom.  Wash drapes weekly or use plastic window coverings. 5. Remove all non-washable stuffed toys from the bedroom.  Wash stuffed toys weekly. 6. Have the room cleaned frequently with a vacuum cleaner and a damp dust-mop.  The patient should not be in a room which  is being cleaned and should wait 1 hour after cleaning before going into the room. 7. Close and seal  all heating outlets in the bedroom.  Otherwise, the room will become filled with dust-laden air.  An electric heater can be used to heat the room. 8. Reduce indoor humidity to less than 50%.  Do not use a humidifier.    Reducing Pollen Exposure  The American Academy of Allergy, Asthma and Immunology suggests the following steps to reduce your exposure to pollen during allergy seasons.    1. Do not hang sheets or clothing out to dry; pollen may collect on these items. 2. Do not mow lawns or spend time around freshly cut grass; mowing stirs up pollen. 3. Keep windows closed at night.  Keep car windows closed while driving. 4. Minimize morning activities outdoors, a time when pollen counts are usually at their highest. 5. Stay indoors as much as possible when pollen counts or humidity is high and on windy days when pollen tends to remain in the air longer. 6. Use air conditioning when possible.  Many air conditioners have filters that trap the pollen spores. 7. Use a HEPA room air filter to remove pollen form the indoor air you breathe.   Control of Mold Allergen  Mold and fungi can grow on a variety of surfaces provided certain temperature and moisture conditions exist.  Outdoor molds grow on plants, decaying vegetation and soil.  The major outdoor mold, Alternaria and Cladosporium, are found in very high numbers during hot and dry conditions.  Generally, a late Summer - Fall peak is seen for common outdoor fungal spores.  Rain will temporarily lower outdoor mold spore count, but counts rise rapidly when the rainy period ends.  The most important indoor molds are Aspergillus and Penicillium.  Dark, humid and poorly ventilated basements are ideal sites for mold growth.  The next most common sites of mold growth are the bathroom and the kitchen.  Outdoor MicrosoftMold Control 3. Use air conditioning and keep windows closed 4. Avoid exposure to decaying vegetation. 5. Avoid leaf raking. 6. Avoid grain  handling. 7. Consider wearing a face mask if working in moldy areas.  Indoor Mold Control 1. Maintain humidity below 50%. 2. Clean washable surfaces with 5% bleach solution. 3. Remove sources e.g. Contaminated carpets.  Control of Cockroach Allergen  Cockroach allergen has been identified as an important cause of acute attacks of asthma, especially in urban settings.  There are fifty-five species of cockroach that exist in the [place], however only three, the [place], [place] and Oriental species produce allergen that can affect patients with Asthma.  Allergens can be obtained from fecal particles, egg casings and secretions from cockroaches.    1. Remove food sources. 2. Reduce access to water. 3. Seal access and entry points. 4. Spray runways with 0.5-1% Diazinon or Chlorpyrifos 5. Blow boric acid power under stoves and refrigerator. 6. Place bait stations (hydramethylnon) at feeding sites.  Control of Dog or Cat Allergen  Avoidance is the best way to manage a dog or cat allergy. If you have a dog or cat and are allergic to dog or cats, consider removing the dog or cat from the home. If you have a dog or cat but don't want to find it a new home, or if your family wants a pet even though someone in the household is allergic, here are some strategies that may help keep symptoms at bay:  1. Keep the pet out of  your bedroom and restrict it to only a few rooms. Be advised that keeping the dog or cat in only one room will not limit the allergens to that room. 2. Don't pet, hug or kiss the dog or cat; if you do, wash your hands with soap and water. 3. High-efficiency particulate air (HEPA) cleaners run continuously in a bedroom or living room can reduce allergen levels over time. 4. Regular use of a high-efficiency vacuum cleaner or a central vacuum can reduce allergen levels. 5. Giving your dog or cat a bath at least once a week can reduce airborne allergen.

## 2016-08-18 LAB — IGG, IGA, IGM
IgA: 470 mg/dL — ABNORMAL HIGH (ref 81–463)
IgG (Immunoglobin G), Serum: 1017 mg/dL (ref 694–1618)
IgM, Serum: 145 mg/dL (ref 48–271)

## 2016-08-20 LAB — STREP PNEUMONIAE 14 SEROTYPES IGG
Strep pneumo Type 12: <0.3
Strep pneumo Type 19: 0.4
Strep pneumo Type 4: <0.3
Strep pneumo Type 9: <0.3
Strep pneumoniae Type 1 Abs: 0.9
Strep pneumoniae Type 14 Abs: 1
Strep pneumoniae Type 18C Abs: 1.9
Strep pneumoniae Type 23F Abs: 0.5
Strep pneumoniae Type 3 Abs: 0.3
Strep pneumoniae Type 5 Abs: 0.5
Strep pneumoniae Type 6B Abs: 0.5
Strep pneumoniae Type 7F Abs: <0.3
Strep pneumoniae Type 8 Abs: <0.3
Strep pneumoniae Type 9N Abs: <0.3

## 2016-08-20 LAB — TETANUS ANTIBODY, IGG: Tetanus Antitoxid Ab: 2.07 IU/mL (ref >0.15)

## 2016-08-25 NOTE — Progress Notes (Signed)
Vials to be made 08-25-16.  jm

## 2016-08-27 ENCOUNTER — Telehealth: Payer: Self-pay

## 2016-08-27 DIAGNOSIS — D849 Immunodeficiency, unspecified: Secondary | ICD-10-CM

## 2016-08-27 DIAGNOSIS — J301 Allergic rhinitis due to pollen: Secondary | ICD-10-CM | POA: Diagnosis not present

## 2016-08-27 NOTE — Telephone Encounter (Signed)
Verbal order with co-sign required for post pneumococcal titers.  Will mail a hand written rx to get Pneumovax from patients primary doctor.  Patient informed. Will mail copy of lab results for patient to give to his doctor with Pneumovax order.

## 2016-08-27 NOTE — Telephone Encounter (Signed)
Noted and done

## 2016-08-28 DIAGNOSIS — J3089 Other allergic rhinitis: Secondary | ICD-10-CM | POA: Diagnosis not present

## 2016-09-08 ENCOUNTER — Ambulatory Visit (INDEPENDENT_AMBULATORY_CARE_PROVIDER_SITE_OTHER): Payer: BC Managed Care – PPO

## 2016-09-08 DIAGNOSIS — J309 Allergic rhinitis, unspecified: Secondary | ICD-10-CM

## 2016-09-08 NOTE — Progress Notes (Signed)
Immunotherapy   Patient Details  Name: Christopher PitmanDavid Medina MRN: [id number] Date of Birth: 12-21-1965  09/08/2016  Christopher Medina blue vial 1:100,000 (L)grass-weed 0.05 and (R)m-dm-cr-r 0.05 Following schedule: A  Frequency:1 time per week Epi-Pen:Epi-Pen Available  Consent signed and patient instructions given.   Christopher HodgkinsMichelle Adyn Medina 09/08/2016, 10:53 AM

## 2016-09-15 ENCOUNTER — Ambulatory Visit (INDEPENDENT_AMBULATORY_CARE_PROVIDER_SITE_OTHER): Payer: BC Managed Care – PPO

## 2016-09-15 DIAGNOSIS — J309 Allergic rhinitis, unspecified: Secondary | ICD-10-CM | POA: Diagnosis not present

## 2016-09-23 ENCOUNTER — Ambulatory Visit (INDEPENDENT_AMBULATORY_CARE_PROVIDER_SITE_OTHER): Payer: BC Managed Care – PPO | Admitting: *Deleted

## 2016-09-23 DIAGNOSIS — J309 Allergic rhinitis, unspecified: Secondary | ICD-10-CM

## 2016-09-29 ENCOUNTER — Ambulatory Visit (INDEPENDENT_AMBULATORY_CARE_PROVIDER_SITE_OTHER): Payer: BC Managed Care – PPO

## 2016-09-29 DIAGNOSIS — J309 Allergic rhinitis, unspecified: Secondary | ICD-10-CM

## 2016-10-20 ENCOUNTER — Ambulatory Visit (INDEPENDENT_AMBULATORY_CARE_PROVIDER_SITE_OTHER): Payer: BC Managed Care – PPO

## 2016-10-20 DIAGNOSIS — J309 Allergic rhinitis, unspecified: Secondary | ICD-10-CM | POA: Diagnosis not present

## 2016-10-27 ENCOUNTER — Ambulatory Visit (INDEPENDENT_AMBULATORY_CARE_PROVIDER_SITE_OTHER): Payer: BC Managed Care – PPO

## 2016-10-27 DIAGNOSIS — J309 Allergic rhinitis, unspecified: Secondary | ICD-10-CM

## 2016-11-10 ENCOUNTER — Ambulatory Visit (INDEPENDENT_AMBULATORY_CARE_PROVIDER_SITE_OTHER): Payer: BC Managed Care – PPO

## 2016-11-10 DIAGNOSIS — J309 Allergic rhinitis, unspecified: Secondary | ICD-10-CM | POA: Diagnosis not present

## 2016-11-17 ENCOUNTER — Ambulatory Visit (INDEPENDENT_AMBULATORY_CARE_PROVIDER_SITE_OTHER): Payer: BC Managed Care – PPO

## 2016-11-17 DIAGNOSIS — J309 Allergic rhinitis, unspecified: Secondary | ICD-10-CM

## 2016-11-23 ENCOUNTER — Ambulatory Visit (INDEPENDENT_AMBULATORY_CARE_PROVIDER_SITE_OTHER): Payer: BC Managed Care – PPO

## 2016-11-23 DIAGNOSIS — J309 Allergic rhinitis, unspecified: Secondary | ICD-10-CM | POA: Diagnosis not present

## 2016-12-14 ENCOUNTER — Ambulatory Visit (INDEPENDENT_AMBULATORY_CARE_PROVIDER_SITE_OTHER): Payer: BC Managed Care – PPO | Admitting: Allergy and Immunology

## 2016-12-14 ENCOUNTER — Encounter: Payer: Self-pay | Admitting: Allergy and Immunology

## 2016-12-14 VITALS — BP 152/98 | HR 94 | Temp 98.0°F | Resp 20

## 2016-12-14 DIAGNOSIS — J3089 Other allergic rhinitis: Secondary | ICD-10-CM

## 2016-12-14 DIAGNOSIS — R030 Elevated blood-pressure reading, without diagnosis of hypertension: Secondary | ICD-10-CM | POA: Diagnosis not present

## 2016-12-14 DIAGNOSIS — J45901 Unspecified asthma with (acute) exacerbation: Secondary | ICD-10-CM

## 2016-12-14 DIAGNOSIS — J320 Chronic maxillary sinusitis: Secondary | ICD-10-CM | POA: Diagnosis not present

## 2016-12-14 MED ORDER — PREDNISONE 1 MG PO TABS: 10.0000 mg | ORAL_TABLET | Freq: Every day | ORAL | Status: DC

## 2016-12-14 MED ORDER — FLUTICASONE PROPIONATE HFA 110 MCG/ACT IN AERO: 2.0000 | INHALATION_SPRAY | Freq: Two times a day (BID) | RESPIRATORY_TRACT | 3 refills | Status: DC | PRN

## 2016-12-14 NOTE — Assessment & Plan Note (Signed)
   Continue appropriate allergen avoidance measures.  Resume aeroallergen immunotherapy injections after current asthma exacerbation has resolved.  Continue Dymista, nasal saline irrigation, and montelukast as prescribed.

## 2016-12-14 NOTE — Progress Notes (Signed)
Follow-up Note  RE: Christopher Medina MRN: [id number] DOB: 19-Jul-1966 Date of Office Visit: 12/14/2016  Primary care provider: Philis NettleURNER, LISA, FNP Referring provider: Philis Nettleurner, Lisa, FNP  History of present illness: Christopher Medina is a 51 y.o. male with allergic rhinosinusitis, allergic conjunctivitis, and allergic bronchitis/wheezing presenting today for a sick visit.  He was previously seen in this clinic for his initial evaluation on 08/17/2016.  He is accompanied today by his wife who assists with the history.  He reports that since November he has had 3 sinus infections requiring antibiotics.  The most recent sinus infection was 3 weeks ago and was treated with cefdinir.  The symptoms seem to improve on antibiotics but then recur after the course has ended.  Over the past few weeks, with the sinus infection, he has experienced frequent lower respiratory symptoms including coughing, dyspnea, and wheezing.  He received Pneumovax but has not had the postvaccination pneumococcal titers drawn yet.  He has been receiving aeroallergen immunotherapy injections without problems or complications, however sore has had to miss some rounds of injections due to sinusitis and bronchitis.  He has noticed that when he consumes dairy products is nasal/sinus symptoms seem to be worse.   Assessment and plan: Asthma with acute exacerbation  Prednisone has been provided, 40 mg x3 days, 20 mg x1 day, 10 mg x1 day, then stop.  During respiratory tract infections or asthma flares, add Flovent 110g 2 inhalations 2 times per day until symptoms have returned to baseline. To maximize pulmonary deposition, a spacer has been provided along with instructions for its proper administration with an HFA inhaler.  Continue montelukast 10 mg daily bedtime and albuterol HFA, 1-2 inhalations every 4-6 hours as needed.  The patient has been asked to contact me if his symptoms persist or progress. Otherwise, he may return for follow up  in 4 months.  Recurrent sinusitis  A lab order has been resubmitted for post vaccine pneumococcal titer levels.  Continue Dymista, 2 sprays per nostril twice a day, and nasal saline irrigation.  For thick post nasal drainage, add guaifenesin 1200 mg (Mucinex Maximum Strength)  twice daily as needed with adequate hydration as discussed.  As consumption of dairy products seems to increase his nasal/sinus congestion and mucus production, he will decrease/discontinue consumption of dairy products for at least 6 weeks and monitor for symptom change.  If symptoms persist or progress, consider otolaryngology consultation to rule out polyps and other anatomic issues.  Perennial and seasonal allergic rhinitis  Continue appropriate allergen avoidance measures.  Resume aeroallergen immunotherapy injections after current asthma exacerbation has resolved.  Continue Dymista, nasal saline irrigation, and montelukast as prescribed.  Elevated blood-pressure reading without diagnosis of hypertension  Onalee HuaDavid reports that he monitors his blood pressure on a regular basis at home with normal readings but has "white coat syndrome" in our office.  Continue home monitoring.  This issue will be followed by his primary care physician.   Meds ordered this encounter  Medications  . fluticasone (FLOVENT HFA) 110 MCG/ACT inhaler    Sig: Inhale 2 puffs into the lungs 2 (two) times daily as needed (asthma flares or respiratory infections). With spacer.    Dispense:  1 Inhaler    Refill:  3  . predniSONE (DELTASONE) tablet 10 mg    Diagnostics: Spirometry revealed an FVC of 2.74 L and an FEV1 of 2.27 L (53% predicted) without postbronchodilator improvement.  The FVC and FEV1 are significantly lower than previous study.   Please  see scanned spirometry results for details.    Physical examination: Blood pressure (!) 152/98, pulse 94, temperature 98 F (36.7 C), temperature source Oral, resp. rate 20, SpO2 94  %.  General: Alert, interactive, in no acute distress. HEENT: TMs pearly gray, turbinates edematous with thick discharge, post-pharynx erythematous. Neck: Supple without lymphadenopathy. Lungs: Decreased breath sounds with expiratory wheezing bilaterally. CV: Normal S1, S2 without murmurs. Skin: Warm and dry, without lesions or rashes.  The following portions of the patient's history were reviewed and updated as appropriate: allergies, current medications, past family history, past medical history, past social history, past surgical history and problem list.  Allergies as of 12/14/2016   No Known Allergies     Medication List       Accurate as of 12/14/16 12:28 PM. Always use your most recent med list.          Albuterol Sulfate 108 (90 Base) MCG/ACT Aepb Commonly known as:  PROAIR RESPICLICK Inhale 2 puffs into the lungs every 4 (four) hours as needed.   Azelastine-Fluticasone 137-50 MCG/ACT Susp Commonly known as:  DYMISTA Place 1 spray into the nose 2 (two) times daily as needed.   cetirizine 10 MG tablet Commonly known as:  ZYRTEC Take 10 mg by mouth daily.   EPINEPHrine 0.3 mg/0.3 mL Soaj injection Commonly known as:  AUVI-Q Use as directed for sever allergic reactions   fluticasone 110 MCG/ACT inhaler Commonly known as:  FLOVENT HFA Inhale 2 puffs into the lungs 2 (two) times daily as needed (asthma flares or respiratory infections). With spacer.   ibuprofen 200 MG tablet Commonly known as:  ADVIL,MOTRIN Take 200 mg by mouth every 6 (six) hours as needed.   mometasone 50 MCG/ACT nasal spray Commonly known as:  NASONEX Place 2 sprays into the nose daily.   montelukast 10 MG tablet Commonly known as:  SINGULAIR Take 10 mg by mouth at bedtime.   Olopatadine HCl 0.7 % Soln Commonly known as:  PAZEO Place 1 drop into both eyes 1 day or 1 dose.   omeprazole 40 MG capsule Commonly known as:  PRILOSEC Take 40 mg by mouth daily.       No Known  Allergies  Review of systems: Review of systems negative except as noted in HPI / PMHx or noted below: Constitutional: Negative.  HENT: Negative.   Eyes: Negative.  Respiratory: Negative.   Cardiovascular: Negative.  Gastrointestinal: Negative.  Genitourinary: Negative.  Musculoskeletal: Negative.  Neurological: Negative.  Endo/Heme/Allergies: Negative.  Cutaneous: Negative.  Past Medical History:  Diagnosis Date  . Recurrent upper respiratory infection (URI)     Family History  Problem Relation Age of Onset  . Allergic rhinitis Mother   . Allergic rhinitis Sister   . Angioedema Paternal Grandmother     Social History   Social History  . Marital status: Married    Spouse name: N/A  . Number of children: N/A  . Years of education: N/A   Occupational History  . Not on file.   Social History Main Topics  . Smoking status: Never Smoker  . Smokeless tobacco: Never Used  . Alcohol use No  . Drug use: No  . Sexual activity: Not on file   Other Topics Concern  . Not on file   Social History Narrative  . No narrative on file    I appreciate the opportunity to take part in Roddie's care. Please do not hesitate to contact me with questions.  Sincerely,   [employer],  MD

## 2016-12-14 NOTE — Assessment & Plan Note (Addendum)
   A lab order has been resubmitted for post vaccine pneumococcal titer levels.  Continue Dymista, 2 sprays per nostril twice a day, and nasal saline irrigation.  For thick post nasal drainage, add guaifenesin 1200 mg (Mucinex Maximum Strength)  twice daily as needed with adequate hydration as discussed.  As consumption of dairy products seems to increase his nasal/sinus congestion and mucus production, he will decrease/discontinue consumption of dairy products for at least 6 weeks and monitor for symptom change.  If symptoms persist or progress, consider otolaryngology consultation to rule out polyps and other anatomic issues.

## 2016-12-14 NOTE — Patient Instructions (Addendum)
Asthma with acute exacerbation  Prednisone has been provided, 40 mg x3 days, 20 mg x1 day, 10 mg x1 day, then stop.  During respiratory tract infections or asthma flares, add Flovent 110g 2 inhalations 2 times per day until symptoms have returned to baseline. To maximize pulmonary deposition, a spacer has been provided along with instructions for its proper administration with an HFA inhaler.  Continue montelukast 10 mg daily bedtime and albuterol HFA, 1-2 inhalations every 4-6 hours as needed.  The patient has been asked to contact me if his symptoms persist or progress. Otherwise, he may return for follow up in 4 months.  Recurrent sinusitis  A lab order has been resubmitted for post vaccine pneumococcal titer levels.  Continue Dymista, 2 sprays per nostril twice a day, and nasal saline irrigation.  For thick post nasal drainage, add guaifenesin 1200 mg (Mucinex Maximum Strength)  twice daily as needed with adequate hydration as discussed.  As consumption of dairy products seems to increase his nasal/sinus congestion and mucus production, he will decrease/discontinue consumption of dairy products for at least 6 weeks and monitor for symptom change.  If symptoms persist or progress, consider otolaryngology consultation to rule out polyps and other anatomic issues.  Perennial and seasonal allergic rhinitis  Continue appropriate allergen avoidance measures.  Resume aeroallergen immunotherapy injections after current asthma exacerbation has resolved.  Continue Dymista, nasal saline irrigation, and montelukast as prescribed.  Elevated blood-pressure reading without diagnosis of hypertension  Christopher HuaDavid reports that he monitors his blood pressure on a regular basis at home with normal readings but has "white coat syndrome" in our office.  Continue home monitoring.  This issue will be followed by his primary care physician.   Return in about 4 months (around 04/15/2017), or if symptoms  worsen or fail to improve.

## 2016-12-14 NOTE — Assessment & Plan Note (Signed)
   Christopher Medina reports that he monitors his blood pressure on a regular basis at home with normal readings but has "white coat syndrome" in our office.  Continue home monitoring.  This issue will be followed by his primary care physician.

## 2016-12-14 NOTE — Assessment & Plan Note (Signed)
   Prednisone has been provided, 40 mg x3 days, 20 mg x1 day, 10 mg x1 day, then stop.  During respiratory tract infections or asthma flares, add Flovent 110g 2 inhalations 2 times per day until symptoms have returned to baseline. To maximize pulmonary deposition, a spacer has been provided along with instructions for its proper administration with an HFA inhaler.  Continue montelukast 10 mg daily bedtime and albuterol HFA, 1-2 inhalations every 4-6 hours as needed.  The patient has been asked to contact me if his symptoms persist or progress. Otherwise, he may return for follow up in 4 months.

## 2016-12-22 ENCOUNTER — Ambulatory Visit (INDEPENDENT_AMBULATORY_CARE_PROVIDER_SITE_OTHER): Payer: BC Managed Care – PPO

## 2016-12-22 DIAGNOSIS — J309 Allergic rhinitis, unspecified: Secondary | ICD-10-CM

## 2016-12-30 ENCOUNTER — Ambulatory Visit (INDEPENDENT_AMBULATORY_CARE_PROVIDER_SITE_OTHER): Payer: BC Managed Care – PPO

## 2016-12-30 DIAGNOSIS — J309 Allergic rhinitis, unspecified: Secondary | ICD-10-CM

## 2017-01-07 ENCOUNTER — Ambulatory Visit (INDEPENDENT_AMBULATORY_CARE_PROVIDER_SITE_OTHER): Payer: BC Managed Care – PPO | Admitting: *Deleted

## 2017-01-07 DIAGNOSIS — J309 Allergic rhinitis, unspecified: Secondary | ICD-10-CM

## 2017-01-18 ENCOUNTER — Ambulatory Visit (INDEPENDENT_AMBULATORY_CARE_PROVIDER_SITE_OTHER): Payer: BC Managed Care – PPO

## 2017-01-18 DIAGNOSIS — J309 Allergic rhinitis, unspecified: Secondary | ICD-10-CM | POA: Diagnosis not present

## 2017-01-28 ENCOUNTER — Ambulatory Visit (INDEPENDENT_AMBULATORY_CARE_PROVIDER_SITE_OTHER): Payer: BC Managed Care – PPO

## 2017-01-28 DIAGNOSIS — J309 Allergic rhinitis, unspecified: Secondary | ICD-10-CM | POA: Diagnosis not present

## 2017-02-02 ENCOUNTER — Ambulatory Visit (INDEPENDENT_AMBULATORY_CARE_PROVIDER_SITE_OTHER): Payer: BC Managed Care – PPO | Admitting: *Deleted

## 2017-02-02 DIAGNOSIS — J309 Allergic rhinitis, unspecified: Secondary | ICD-10-CM

## 2017-02-10 ENCOUNTER — Ambulatory Visit (INDEPENDENT_AMBULATORY_CARE_PROVIDER_SITE_OTHER): Payer: BC Managed Care – PPO

## 2017-02-10 DIAGNOSIS — J309 Allergic rhinitis, unspecified: Secondary | ICD-10-CM

## 2017-02-25 ENCOUNTER — Ambulatory Visit (INDEPENDENT_AMBULATORY_CARE_PROVIDER_SITE_OTHER): Payer: BC Managed Care – PPO | Admitting: *Deleted

## 2017-02-25 DIAGNOSIS — J309 Allergic rhinitis, unspecified: Secondary | ICD-10-CM | POA: Diagnosis not present

## 2017-03-04 ENCOUNTER — Ambulatory Visit (INDEPENDENT_AMBULATORY_CARE_PROVIDER_SITE_OTHER): Payer: BC Managed Care – PPO | Admitting: *Deleted

## 2017-03-04 DIAGNOSIS — J309 Allergic rhinitis, unspecified: Secondary | ICD-10-CM | POA: Diagnosis not present

## 2017-03-09 DIAGNOSIS — J301 Allergic rhinitis due to pollen: Secondary | ICD-10-CM | POA: Insufficient documentation

## 2017-03-11 ENCOUNTER — Ambulatory Visit (INDEPENDENT_AMBULATORY_CARE_PROVIDER_SITE_OTHER): Payer: BC Managed Care – PPO

## 2017-03-11 DIAGNOSIS — J309 Allergic rhinitis, unspecified: Secondary | ICD-10-CM

## 2017-03-18 ENCOUNTER — Ambulatory Visit (INDEPENDENT_AMBULATORY_CARE_PROVIDER_SITE_OTHER): Payer: BC Managed Care – PPO | Admitting: *Deleted

## 2017-03-18 DIAGNOSIS — J309 Allergic rhinitis, unspecified: Secondary | ICD-10-CM

## 2017-03-19 ENCOUNTER — Telehealth: Payer: Self-pay | Admitting: *Deleted

## 2017-03-19 NOTE — Telephone Encounter (Signed)
Patient walked in today because he has swelling in his left check, it looked puffy and he said he had pressure and pain in his check. I told him that I was not sure if it was related to his allergy shots that he received yesterday. He is on grass-weed-tree and mold-dmite-cr on Gold vial schedule A, he received 0.2cc on both yesterday. He did tell me he has a dentist apt scheduled on Monday. We are not seeing patients this afternoon so we could not work him in. I told him to go to urgent care if it gets worse.

## 2017-03-19 NOTE — Telephone Encounter (Signed)
Per Dr. Beaulah DinningBardelas we were going to send in an antibiotic but I spoke with patient and he was able to see a dentist today and he does have a tooth infection and he already has an antibiotic sent in from his dentist.

## 2017-03-19 NOTE — Telephone Encounter (Signed)
Told patient I would call him on Monday to see how he is doing by then.

## 2017-04-02 ENCOUNTER — Ambulatory Visit (INDEPENDENT_AMBULATORY_CARE_PROVIDER_SITE_OTHER): Payer: BC Managed Care – PPO | Admitting: *Deleted

## 2017-04-02 DIAGNOSIS — J309 Allergic rhinitis, unspecified: Secondary | ICD-10-CM | POA: Diagnosis not present

## 2017-04-08 ENCOUNTER — Ambulatory Visit (INDEPENDENT_AMBULATORY_CARE_PROVIDER_SITE_OTHER): Payer: BC Managed Care – PPO

## 2017-04-08 DIAGNOSIS — J309 Allergic rhinitis, unspecified: Secondary | ICD-10-CM

## 2017-04-16 ENCOUNTER — Ambulatory Visit (INDEPENDENT_AMBULATORY_CARE_PROVIDER_SITE_OTHER): Payer: BC Managed Care – PPO

## 2017-04-16 DIAGNOSIS — J309 Allergic rhinitis, unspecified: Secondary | ICD-10-CM | POA: Diagnosis not present

## 2017-04-19 ENCOUNTER — Ambulatory Visit (INDEPENDENT_AMBULATORY_CARE_PROVIDER_SITE_OTHER): Payer: BC Managed Care – PPO

## 2017-04-19 DIAGNOSIS — J309 Allergic rhinitis, unspecified: Secondary | ICD-10-CM | POA: Diagnosis not present

## 2017-04-22 ENCOUNTER — Ambulatory Visit: Payer: BC Managed Care – PPO | Admitting: Pediatrics

## 2017-04-26 ENCOUNTER — Ambulatory Visit (INDEPENDENT_AMBULATORY_CARE_PROVIDER_SITE_OTHER): Payer: BC Managed Care – PPO

## 2017-04-26 DIAGNOSIS — J309 Allergic rhinitis, unspecified: Secondary | ICD-10-CM

## 2017-05-05 ENCOUNTER — Ambulatory Visit (INDEPENDENT_AMBULATORY_CARE_PROVIDER_SITE_OTHER): Payer: BC Managed Care – PPO

## 2017-05-05 DIAGNOSIS — J309 Allergic rhinitis, unspecified: Secondary | ICD-10-CM | POA: Diagnosis not present

## 2017-05-13 ENCOUNTER — Ambulatory Visit (INDEPENDENT_AMBULATORY_CARE_PROVIDER_SITE_OTHER): Payer: BC Managed Care – PPO

## 2017-05-13 DIAGNOSIS — J309 Allergic rhinitis, unspecified: Secondary | ICD-10-CM

## 2017-05-20 ENCOUNTER — Ambulatory Visit (INDEPENDENT_AMBULATORY_CARE_PROVIDER_SITE_OTHER): Payer: BC Managed Care – PPO

## 2017-05-20 DIAGNOSIS — J309 Allergic rhinitis, unspecified: Secondary | ICD-10-CM

## 2017-05-27 ENCOUNTER — Ambulatory Visit (INDEPENDENT_AMBULATORY_CARE_PROVIDER_SITE_OTHER): Payer: BC Managed Care – PPO | Admitting: *Deleted

## 2017-05-27 DIAGNOSIS — J309 Allergic rhinitis, unspecified: Secondary | ICD-10-CM | POA: Diagnosis not present

## 2017-06-03 ENCOUNTER — Ambulatory Visit (INDEPENDENT_AMBULATORY_CARE_PROVIDER_SITE_OTHER): Payer: BC Managed Care – PPO

## 2017-06-03 DIAGNOSIS — J309 Allergic rhinitis, unspecified: Secondary | ICD-10-CM

## 2017-06-10 ENCOUNTER — Ambulatory Visit (INDEPENDENT_AMBULATORY_CARE_PROVIDER_SITE_OTHER): Payer: BC Managed Care – PPO

## 2017-06-10 DIAGNOSIS — J309 Allergic rhinitis, unspecified: Secondary | ICD-10-CM | POA: Diagnosis not present

## 2017-06-17 ENCOUNTER — Ambulatory Visit (INDEPENDENT_AMBULATORY_CARE_PROVIDER_SITE_OTHER): Payer: BC Managed Care – PPO

## 2017-06-17 DIAGNOSIS — J309 Allergic rhinitis, unspecified: Secondary | ICD-10-CM | POA: Diagnosis not present

## 2017-06-22 ENCOUNTER — Ambulatory Visit (INDEPENDENT_AMBULATORY_CARE_PROVIDER_SITE_OTHER): Payer: BC Managed Care – PPO

## 2017-06-22 DIAGNOSIS — J309 Allergic rhinitis, unspecified: Secondary | ICD-10-CM | POA: Diagnosis not present

## 2017-07-01 ENCOUNTER — Ambulatory Visit (INDEPENDENT_AMBULATORY_CARE_PROVIDER_SITE_OTHER): Payer: BC Managed Care – PPO

## 2017-07-01 DIAGNOSIS — J309 Allergic rhinitis, unspecified: Secondary | ICD-10-CM

## 2017-07-08 ENCOUNTER — Ambulatory Visit (INDEPENDENT_AMBULATORY_CARE_PROVIDER_SITE_OTHER): Payer: BC Managed Care – PPO

## 2017-07-08 DIAGNOSIS — J309 Allergic rhinitis, unspecified: Secondary | ICD-10-CM

## 2017-07-22 ENCOUNTER — Ambulatory Visit (INDEPENDENT_AMBULATORY_CARE_PROVIDER_SITE_OTHER): Payer: BC Managed Care – PPO

## 2017-07-22 DIAGNOSIS — J309 Allergic rhinitis, unspecified: Secondary | ICD-10-CM | POA: Diagnosis not present

## 2017-07-28 ENCOUNTER — Ambulatory Visit (INDEPENDENT_AMBULATORY_CARE_PROVIDER_SITE_OTHER): Payer: BC Managed Care – PPO

## 2017-07-28 DIAGNOSIS — J309 Allergic rhinitis, unspecified: Secondary | ICD-10-CM | POA: Diagnosis not present

## 2017-08-04 ENCOUNTER — Ambulatory Visit (INDEPENDENT_AMBULATORY_CARE_PROVIDER_SITE_OTHER): Payer: BC Managed Care – PPO

## 2017-08-04 DIAGNOSIS — J309 Allergic rhinitis, unspecified: Secondary | ICD-10-CM | POA: Diagnosis not present

## 2017-08-10 NOTE — Progress Notes (Signed)
VIALS EXP 08-11-18 

## 2017-08-12 ENCOUNTER — Ambulatory Visit (INDEPENDENT_AMBULATORY_CARE_PROVIDER_SITE_OTHER): Payer: BLUE CROSS/BLUE SHIELD

## 2017-08-12 DIAGNOSIS — J309 Allergic rhinitis, unspecified: Secondary | ICD-10-CM | POA: Diagnosis not present

## 2017-08-13 DIAGNOSIS — J301 Allergic rhinitis due to pollen: Secondary | ICD-10-CM | POA: Diagnosis not present

## 2017-08-25 ENCOUNTER — Ambulatory Visit (INDEPENDENT_AMBULATORY_CARE_PROVIDER_SITE_OTHER): Payer: BLUE CROSS/BLUE SHIELD

## 2017-08-25 DIAGNOSIS — J309 Allergic rhinitis, unspecified: Secondary | ICD-10-CM | POA: Diagnosis not present

## 2017-08-31 ENCOUNTER — Ambulatory Visit (INDEPENDENT_AMBULATORY_CARE_PROVIDER_SITE_OTHER): Payer: BLUE CROSS/BLUE SHIELD | Admitting: *Deleted

## 2017-08-31 DIAGNOSIS — J309 Allergic rhinitis, unspecified: Secondary | ICD-10-CM | POA: Diagnosis not present

## 2017-09-08 ENCOUNTER — Ambulatory Visit (INDEPENDENT_AMBULATORY_CARE_PROVIDER_SITE_OTHER): Payer: BLUE CROSS/BLUE SHIELD

## 2017-09-08 DIAGNOSIS — J309 Allergic rhinitis, unspecified: Secondary | ICD-10-CM

## 2017-09-17 ENCOUNTER — Ambulatory Visit (INDEPENDENT_AMBULATORY_CARE_PROVIDER_SITE_OTHER): Payer: BLUE CROSS/BLUE SHIELD

## 2017-09-17 DIAGNOSIS — J309 Allergic rhinitis, unspecified: Secondary | ICD-10-CM

## 2017-09-22 ENCOUNTER — Ambulatory Visit (INDEPENDENT_AMBULATORY_CARE_PROVIDER_SITE_OTHER): Payer: BLUE CROSS/BLUE SHIELD | Admitting: *Deleted

## 2017-09-22 DIAGNOSIS — J309 Allergic rhinitis, unspecified: Secondary | ICD-10-CM

## 2017-09-29 ENCOUNTER — Ambulatory Visit (INDEPENDENT_AMBULATORY_CARE_PROVIDER_SITE_OTHER): Payer: BLUE CROSS/BLUE SHIELD | Admitting: *Deleted

## 2017-09-29 DIAGNOSIS — R062 Wheezing: Secondary | ICD-10-CM

## 2017-09-29 DIAGNOSIS — J309 Allergic rhinitis, unspecified: Secondary | ICD-10-CM | POA: Diagnosis not present

## 2017-09-29 DIAGNOSIS — J3089 Other allergic rhinitis: Secondary | ICD-10-CM

## 2017-09-29 DIAGNOSIS — J45901 Unspecified asthma with (acute) exacerbation: Secondary | ICD-10-CM

## 2017-09-29 MED ORDER — FLUTICASONE PROPIONATE HFA 110 MCG/ACT IN AERO
2.0000 | INHALATION_SPRAY | Freq: Two times a day (BID) | RESPIRATORY_TRACT | 0 refills | Status: DC | PRN
Start: 2017-09-29 — End: 2018-08-10

## 2017-09-29 MED ORDER — ALBUTEROL SULFATE 108 (90 BASE) MCG/ACT IN AEPB: 2.0000 | INHALATION_SPRAY | RESPIRATORY_TRACT | 0 refills | Status: DC | PRN

## 2017-09-29 MED ORDER — AZELASTINE-FLUTICASONE 137-50 MCG/ACT NA SUSP
1.0000 | Freq: Two times a day (BID) | NASAL | 0 refills | Status: DC | PRN
Start: 2017-09-29 — End: 2018-08-10

## 2017-09-29 MED ORDER — MONTELUKAST SODIUM 10 MG PO TABS: 10.0000 mg | ORAL_TABLET | Freq: Every day | ORAL | 0 refills | Status: DC

## 2017-10-07 ENCOUNTER — Ambulatory Visit (INDEPENDENT_AMBULATORY_CARE_PROVIDER_SITE_OTHER): Payer: BLUE CROSS/BLUE SHIELD

## 2017-10-07 DIAGNOSIS — J309 Allergic rhinitis, unspecified: Secondary | ICD-10-CM | POA: Diagnosis not present

## 2017-10-13 ENCOUNTER — Ambulatory Visit (INDEPENDENT_AMBULATORY_CARE_PROVIDER_SITE_OTHER): Payer: BLUE CROSS/BLUE SHIELD | Admitting: *Deleted

## 2017-10-13 DIAGNOSIS — J309 Allergic rhinitis, unspecified: Secondary | ICD-10-CM | POA: Diagnosis not present

## 2017-10-21 ENCOUNTER — Ambulatory Visit (INDEPENDENT_AMBULATORY_CARE_PROVIDER_SITE_OTHER): Payer: BLUE CROSS/BLUE SHIELD

## 2017-10-21 DIAGNOSIS — J309 Allergic rhinitis, unspecified: Secondary | ICD-10-CM

## 2017-11-11 ENCOUNTER — Ambulatory Visit (INDEPENDENT_AMBULATORY_CARE_PROVIDER_SITE_OTHER): Payer: BLUE CROSS/BLUE SHIELD | Admitting: *Deleted

## 2017-11-11 DIAGNOSIS — J309 Allergic rhinitis, unspecified: Secondary | ICD-10-CM | POA: Diagnosis not present

## 2017-11-22 ENCOUNTER — Encounter: Payer: Self-pay | Admitting: Allergy and Immunology

## 2017-11-22 ENCOUNTER — Ambulatory Visit: Payer: BLUE CROSS/BLUE SHIELD | Admitting: Allergy and Immunology

## 2017-11-22 ENCOUNTER — Ambulatory Visit (HOSPITAL_COMMUNITY)
Admission: RE | Admit: 2017-11-22 | Discharge: 2017-11-22 | Disposition: A | Discharge status: Home / Self Care | Payer: BLUE CROSS/BLUE SHIELD | Source: Ambulatory Visit | Attending: Allergy and Immunology | Admitting: Allergy and Immunology

## 2017-11-22 ENCOUNTER — Telehealth: Payer: Self-pay

## 2017-11-22 VITALS — BP 130/86 | HR 97 | Temp 97.7°F | Ht 74.0 in | Wt 235.6 lb

## 2017-11-22 DIAGNOSIS — J0110 Acute frontal sinusitis, unspecified: Secondary | ICD-10-CM

## 2017-11-22 DIAGNOSIS — J3089 Other allergic rhinitis: Secondary | ICD-10-CM | POA: Diagnosis not present

## 2017-11-22 DIAGNOSIS — J330 Polyp of nasal cavity: Secondary | ICD-10-CM

## 2017-11-22 DIAGNOSIS — J0101 Acute recurrent maxillary sinusitis: Secondary | ICD-10-CM | POA: Insufficient documentation

## 2017-11-22 DIAGNOSIS — J45901 Unspecified asthma with (acute) exacerbation: Secondary | ICD-10-CM | POA: Insufficient documentation

## 2017-11-22 MED ORDER — BUDESONIDE-FORMOTEROL FUMARATE 160-4.5 MCG/ACT IN AERO: 2.0000 | INHALATION_SPRAY | Freq: Two times a day (BID) | RESPIRATORY_TRACT | 12 refills | Status: DC

## 2017-11-22 MED ORDER — OMEPRAZOLE 40 MG PO CPDR: 40.0000 mg | DELAYED_RELEASE_CAPSULE | Freq: Every day | ORAL | 3 refills | Status: DC

## 2017-11-22 MED ORDER — FLUTICASONE PROPIONATE 93 MCG/ACT NA EXHU: 1.0000 | INHALANT_SUSPENSION | Freq: Two times a day (BID) | NASAL | 3 refills | Status: DC

## 2017-11-22 MED ORDER — PREDNISONE 1 MG PO TABS: 10.0000 mg | ORAL_TABLET | Freq: Every day | ORAL | Status: DC

## 2017-11-22 NOTE — Assessment & Plan Note (Addendum)
   Prednisone has been provided (as above).  A [facility] been [facility], 2 actuations per nostril twice a day. Proper technique has been discussed and demonstrated.  Nasal saline lavage (NeilMed) has been recommended as needed and prior to medicated nasal sprays along with instructions for proper administration.  For thick post nasal drainage, nasal congestion, and/or sinus pressure, add guaifenesin [phone] mg (Mucinex) plus/minus pseudoephedrine 60-120 mg  twice daily as needed with adequate hydration as discussed. Pseudoephedrine is only to be used for short-term relief of nasal/sinus congestion. Long-term use is discouraged due to potential side effects.  The patient has been asked to contact me if his symptoms persist, progress, or if he becomes febrile.

## 2017-11-22 NOTE — Patient Instructions (Addendum)
Asthma with acute exacerbation  A chest x-ray, PA and lateral, has been ordered to rule out infiltrates.  If the chest x-ray is clear, he will start prednisone 40 mg x3 days, 20 mg x1 day, 10 mg x1 day, then stop.  Prednisone has been provided in the clinic.    A prescription has been provided for Symbicort (budesonide/formoterol) 160/4.5 g,  2 inhalations via spacer device twice a day.  Continue montelukast 10 mg daily at bedtime and albuterol HFA every 6 hours if needed.  The patient has been asked to contact me if his symptoms persist or progress. Otherwise, he may return for follow up in 4 months.  Acute sinusitis  Prednisone has been provided (as above).  A prescription has been [facility] for [facility], 2 actuations per nostril twice a day. Proper technique has been discussed and demonstrated.  Nasal saline lavage (NeilMed) has been recommended as needed and prior to medicated nasal sprays along with instructions for proper administration.  For thick post nasal drainage, nasal congestion, and/or sinus pressure, add guaifenesin [phone] mg (Mucinex) plus/minus pseudoephedrine 60-120 mg  twice daily as needed with adequate hydration as discussed. Pseudoephedrine is only to be used for short-term relief of nasal/sinus congestion. Long-term use is discouraged due to potential side effects.  The patient has been asked to contact me if his symptoms persist, progress, or if he becomes febrile.   Perennial and seasonal allergic rhinitis  Continue appropriate allergen avoidance measures.  Resume aeroallergen immunotherapy injections after current asthma exacerbation has resolved.  Timmothy SoursXhance has been prescribed (as above).  Polyp of nasal cavity  Timmothy SoursXhance has been prescribed (as above).   Return in about 4 months (around 03/22/2018), or if symptoms worsen or fail to improve.

## 2017-11-22 NOTE — Telephone Encounter (Signed)
Per Dr. Nunzio CobbsBobbitt, Chest xray results are negative for pneumonia, Pt was instructed to take Mucinex and begin prednisone as prescribed.

## 2017-11-22 NOTE — Assessment & Plan Note (Signed)
   Continue appropriate allergen avoidance measures.  Resume aeroallergen immunotherapy injections after current asthma exacerbation has resolved.  Timmothy SoursXhance has been prescribed (as above).

## 2017-11-22 NOTE — Assessment & Plan Note (Signed)
   Timmothy SoursXhance has been prescribed (as above).

## 2017-11-22 NOTE — Progress Notes (Signed)
Follow-up Note  RE: Christopher Medina MRN: [id number] DOB: 1966/09/02 Date of Office Visit: 11/22/2017  Primary care provider: Philis Nettle, FNP Referring provider: Philis Nettle, FNP  History of present illness: Christopher Medina is a 52 y.o. male with allergic rhinosinusitis, allergic conjunctivitis, and allergic bronchitis/wheezing presenting today for a sick visit.  He was last seen in this clinic in March 2018.  He reports that over this past month he has been experiencing persistent nasal congestion, thick postnasal drainage, and frequent sinus pressure over the forehead and behind the eyes.  In addition, he has been experiencing episodes of coughing, chest tightness, and wheezing with increased albuterol requirement.  He experiences these lower respiratory symptoms 3 or 4 times per week on average and has been awakened from sleep at night due to lower respiratory symptoms 2 or 3 nights out of the week.  He denies fevers, chills, or discolored mucus production.  He ran out of Dymista a few months ago and is currently using fluticasone nasal spray.  He saw an ENT who noted that he has some small polyps.   Assessment and plan: Asthma with acute exacerbation  A chest x-ray, PA and lateral, has been ordered to rule out infiltrates.  If the chest x-ray is clear, he will start prednisone 40 mg x3 days, 20 mg x1 day, 10 mg x1 day, then stop.  Prednisone has been provided in the clinic.    A prescription has been provided for Symbicort (budesonide/formoterol) 160/4.5 g,  2 inhalations via spacer device twice a day.  Continue montelukast 10 mg daily at bedtime and albuterol HFA every 6 hours if needed.  The patient has been asked to contact me if his symptoms persist or progress. Otherwise, he may return for follow up in 4 months.  Acute sinusitis  Prednisone has been provided (as above).  A prescription has been provided for [facility], 2 actuations per nostril twice a day. Proper technique has been  discussed and demonstrated.  Nasal saline lavage (NeilMed) has been recommended as needed and prior to medicated nasal sprays along with instructions for proper administration.  For thick post nasal drainage, nasal congestion, and/or sinus pressure, add guaifenesin [phone] mg (Mucinex) plus/minus pseudoephedrine 60-120 mg  twice daily as needed with adequate hydration as discussed. Pseudoephedrine is only to be used for short-term relief of nasal/sinus congestion. Long-term use is discouraged due to potential side effects.  The patient has been asked to contact me if his symptoms persist, progress, or if he becomes febrile.   Perennial and seasonal allergic rhinitis  Continue appropriate allergen avoidance measures.  Resume aeroallergen immunotherapy injections after current asthma exacerbation has resolved.  Christopher Medina has been prescribed (as above).  Polyp of nasal cavity  Christopher Medina has been prescribed (as above).   Meds ordered this encounter  Medications  . budesonide-formoterol (SYMBICORT) 160-4.5 MCG/ACT inhaler    Sig: Inhale 2 puffs into the lungs 2 (two) times daily. Via spacer    Dispense:  1 Inhaler    Refill:  12  . Fluticasone Propionate (XHANCE) 93 MCG/ACT EXHU    Sig: Place 1 Inhaler into the nose 2 times daily at 12 noon and 4 pm.    Dispense:  16 mL    Refill:  3  . predniSONE (DELTASONE) tablet 10 mg    Diagnostics: Spirometry reveals an FVC of 2.81 L and an FEV1 of 2.38 L (56% predicted) without postbronchodilator improvement.  Restrictive/obstructive pattern without reversibility today.  Please see scanned spirometry results  for details.    Physical examination: Blood pressure 130/86, pulse 97, temperature 97.7 F (36.5 C), temperature source Oral, height 6\' 2"  (1.88 m), weight 235 lb 9.6 oz (106.9 kg), SpO2 94 %.  General: Alert, interactive, in no acute distress. HEENT: TMs pearly gray, turbinates markedly edematous with thick discharge, post-pharynx  erythematous. Neck: Supple without lymphadenopathy. Lungs: Mildly decreased breath sounds bilaterally with scattered rhonchi bilaterally. CV: Normal S1, S2 without murmurs. Skin: Warm and dry, without lesions or rashes.  The following portions of the patient's history were reviewed and updated as appropriate: allergies, current medications, past family history, past medical history, past social history, past surgical history and problem list.  Allergies as of 11/22/2017   No Known Allergies     Medication List        Accurate as of 11/22/17 11:06 AM. Always use your most recent med list.          Albuterol Sulfate 108 (90 Base) MCG/ACT Aepb Commonly known as:  PROAIR RESPICLICK Inhale 2 puffs into the lungs every 4 (four) hours as needed.   Azelastine-Fluticasone 137-50 MCG/ACT Susp Commonly known as:  DYMISTA Place 1 spray into the nose 2 (two) times daily as needed.   budesonide-formoterol 160-4.5 MCG/ACT inhaler Commonly known as:  SYMBICORT Inhale 2 puffs into the lungs 2 (two) times daily. Via spacer   EPINEPHrine 0.3 mg/0.3 mL Soaj injection Commonly known as:  AUVI-Q Use as directed for sever allergic reactions   fluticasone 110 MCG/ACT inhaler Commonly known as:  FLOVENT HFA Inhale 2 puffs into the lungs 2 (two) times daily as needed (asthma flares or respiratory infections). With spacer.   Fluticasone Propionate 93 MCG/ACT Exhu Commonly known as:  XHANCE Place 1 Inhaler into the nose 2 times daily at 12 noon and 4 pm.   ibuprofen 200 MG tablet Commonly known as:  ADVIL,MOTRIN Take 200 mg by mouth every 6 (six) hours as needed.   mometasone 50 MCG/ACT nasal spray Commonly known as:  NASONEX Place 2 sprays into the nose daily.   montelukast 10 MG tablet Commonly known as:  SINGULAIR Take 1 tablet (10 mg total) by mouth at bedtime.   Olopatadine HCl 0.7 % Soln Commonly known as:  PAZEO Place 1 drop into both eyes 1 day or 1 dose.   omeprazole 40 MG  capsule Commonly known as:  PRILOSEC Take 40 mg by mouth daily.       No Known Allergies  Review of systems: Review of systems negative except as noted in HPI / PMHx or noted below: Constitutional: Negative.  HENT: Negative.   Eyes: Negative.  Respiratory: Negative.   Cardiovascular: Negative.  Gastrointestinal: Negative.  Genitourinary: Negative.  Musculoskeletal: Negative.  Neurological: Negative.  Endo/Heme/Allergies: Negative.  Cutaneous: Negative.  Past Medical History:  Diagnosis Date  . Asthma with acute exacerbation   . Recurrent upper respiratory infection (URI)     Family History  Problem Relation Age of Onset  . Allergic rhinitis Mother   . Allergic rhinitis Sister   . Angioedema Paternal Grandmother     Social History   Socioeconomic History  . Marital status: Married    Spouse name: Not on file  . Number of children: Not on file  . Years of education: Not on file  . Highest education level: Not on file  Social Needs  . Financial resource strain: Not on file  . Food insecurity - worry: Not on file  . Food insecurity - inability: Not on file  .  Transportation needs - medical: Not on file  . Transportation needs - non-medical: Not on file  Occupational History  . Not on file  Tobacco Use  . Smoking status: Never Smoker  . Smokeless tobacco: Never Used  Substance and Sexual Activity  . Alcohol use: No  . Drug use: No  . Sexual activity: Not on file  Other Topics Concern  . Not on file  Social History Narrative  . Not on file    I appreciate the opportunity to take part in Christopher Medina's care. Please do not hesitate to contact me with questions.  Sincerely,   R. Jorene Guestarter Wanna Gully, MD

## 2017-11-22 NOTE — Assessment & Plan Note (Addendum)
   A chest x-ray, PA and lateral, has been ordered to rule out infiltrates.  If the chest x-ray is clear, he will start prednisone 40 mg x3 days, 20 mg x1 day, 10 mg x1 day, then stop.  Prednisone has been provided in the clinic.    A prescription has been provided for Symbicort (budesonide/formoterol) 160/4.5 g, 2 inhalations via spacer device twice a day.  Continue montelukast 10 mg daily at bedtime and albuterol HFA every 6 hours if needed.  The patient has been asked to contact me if his symptoms persist or progress. Otherwise, he may return for follow up in 4 months.

## 2017-11-23 ENCOUNTER — Other Ambulatory Visit: Payer: Self-pay | Admitting: *Deleted

## 2017-11-23 MED ORDER — MONTELUKAST SODIUM 10 MG PO TABS: 10.0000 mg | ORAL_TABLET | Freq: Every day | ORAL | 5 refills | Status: DC

## 2017-11-23 MED ORDER — LEVOCETIRIZINE DIHYDROCHLORIDE 5 MG PO TABS
5.0000 mg | ORAL_TABLET | Freq: Every evening | ORAL | 5 refills | Status: DC
Start: 2017-11-23 — End: 2018-09-20

## 2017-11-25 DIAGNOSIS — J301 Allergic rhinitis due to pollen: Secondary | ICD-10-CM | POA: Diagnosis not present

## 2017-12-02 ENCOUNTER — Ambulatory Visit (INDEPENDENT_AMBULATORY_CARE_PROVIDER_SITE_OTHER): Payer: BLUE CROSS/BLUE SHIELD | Admitting: *Deleted

## 2017-12-02 DIAGNOSIS — J309 Allergic rhinitis, unspecified: Secondary | ICD-10-CM | POA: Diagnosis not present

## 2017-12-06 ENCOUNTER — Ambulatory Visit: Payer: BLUE CROSS/BLUE SHIELD | Admitting: Allergy and Immunology

## 2017-12-09 ENCOUNTER — Ambulatory Visit (INDEPENDENT_AMBULATORY_CARE_PROVIDER_SITE_OTHER): Payer: BLUE CROSS/BLUE SHIELD

## 2017-12-09 DIAGNOSIS — J309 Allergic rhinitis, unspecified: Secondary | ICD-10-CM | POA: Diagnosis not present

## 2017-12-16 ENCOUNTER — Ambulatory Visit (INDEPENDENT_AMBULATORY_CARE_PROVIDER_SITE_OTHER): Payer: BLUE CROSS/BLUE SHIELD | Admitting: *Deleted

## 2017-12-16 DIAGNOSIS — J309 Allergic rhinitis, unspecified: Secondary | ICD-10-CM | POA: Diagnosis not present

## 2018-01-06 ENCOUNTER — Ambulatory Visit (INDEPENDENT_AMBULATORY_CARE_PROVIDER_SITE_OTHER): Payer: BLUE CROSS/BLUE SHIELD

## 2018-01-06 DIAGNOSIS — J309 Allergic rhinitis, unspecified: Secondary | ICD-10-CM

## 2018-01-21 ENCOUNTER — Ambulatory Visit (INDEPENDENT_AMBULATORY_CARE_PROVIDER_SITE_OTHER): Payer: BLUE CROSS/BLUE SHIELD | Admitting: *Deleted

## 2018-01-21 DIAGNOSIS — J309 Allergic rhinitis, unspecified: Secondary | ICD-10-CM

## 2018-02-10 ENCOUNTER — Ambulatory Visit (INDEPENDENT_AMBULATORY_CARE_PROVIDER_SITE_OTHER): Payer: BLUE CROSS/BLUE SHIELD | Admitting: *Deleted

## 2018-02-10 DIAGNOSIS — J309 Allergic rhinitis, unspecified: Secondary | ICD-10-CM | POA: Diagnosis not present

## 2018-02-24 ENCOUNTER — Ambulatory Visit (INDEPENDENT_AMBULATORY_CARE_PROVIDER_SITE_OTHER): Payer: BLUE CROSS/BLUE SHIELD

## 2018-02-24 DIAGNOSIS — J309 Allergic rhinitis, unspecified: Secondary | ICD-10-CM | POA: Diagnosis not present

## 2018-03-10 ENCOUNTER — Ambulatory Visit (INDEPENDENT_AMBULATORY_CARE_PROVIDER_SITE_OTHER): Payer: BLUE CROSS/BLUE SHIELD

## 2018-03-10 DIAGNOSIS — J309 Allergic rhinitis, unspecified: Secondary | ICD-10-CM | POA: Diagnosis not present

## 2018-03-24 ENCOUNTER — Ambulatory Visit (INDEPENDENT_AMBULATORY_CARE_PROVIDER_SITE_OTHER): Payer: BLUE CROSS/BLUE SHIELD | Admitting: *Deleted

## 2018-03-24 DIAGNOSIS — J309 Allergic rhinitis, unspecified: Secondary | ICD-10-CM | POA: Diagnosis not present

## 2018-03-24 MED ORDER — EPINEPHRINE 0.3 MG/0.3ML IJ SOAJ: INTRAMUSCULAR | 1 refills | Status: DC

## 2018-03-29 ENCOUNTER — Telehealth: Payer: Self-pay | Admitting: Allergy

## 2018-03-29 NOTE — Telephone Encounter (Signed)
Left patient a message about call [employer] about the Auvi-Q.

## 2018-04-13 ENCOUNTER — Ambulatory Visit (INDEPENDENT_AMBULATORY_CARE_PROVIDER_SITE_OTHER): Payer: BLUE CROSS/BLUE SHIELD

## 2018-04-13 DIAGNOSIS — J309 Allergic rhinitis, unspecified: Secondary | ICD-10-CM | POA: Diagnosis not present

## 2018-04-21 ENCOUNTER — Ambulatory Visit (INDEPENDENT_AMBULATORY_CARE_PROVIDER_SITE_OTHER): Payer: BLUE CROSS/BLUE SHIELD | Admitting: *Deleted

## 2018-04-21 DIAGNOSIS — J309 Allergic rhinitis, unspecified: Secondary | ICD-10-CM | POA: Diagnosis not present

## 2018-04-28 ENCOUNTER — Ambulatory Visit (INDEPENDENT_AMBULATORY_CARE_PROVIDER_SITE_OTHER): Payer: BLUE CROSS/BLUE SHIELD | Admitting: *Deleted

## 2018-04-28 DIAGNOSIS — J309 Allergic rhinitis, unspecified: Secondary | ICD-10-CM | POA: Diagnosis not present

## 2018-05-05 ENCOUNTER — Ambulatory Visit (INDEPENDENT_AMBULATORY_CARE_PROVIDER_SITE_OTHER): Payer: BLUE CROSS/BLUE SHIELD

## 2018-05-05 DIAGNOSIS — J309 Allergic rhinitis, unspecified: Secondary | ICD-10-CM

## 2018-05-13 ENCOUNTER — Ambulatory Visit (INDEPENDENT_AMBULATORY_CARE_PROVIDER_SITE_OTHER): Payer: BLUE CROSS/BLUE SHIELD

## 2018-05-13 DIAGNOSIS — J309 Allergic rhinitis, unspecified: Secondary | ICD-10-CM | POA: Diagnosis not present

## 2018-05-17 ENCOUNTER — Other Ambulatory Visit: Payer: Self-pay | Admitting: Allergy and Immunology

## 2018-05-19 ENCOUNTER — Ambulatory Visit (INDEPENDENT_AMBULATORY_CARE_PROVIDER_SITE_OTHER): Payer: BLUE CROSS/BLUE SHIELD

## 2018-05-19 DIAGNOSIS — J309 Allergic rhinitis, unspecified: Secondary | ICD-10-CM | POA: Diagnosis not present

## 2018-05-26 ENCOUNTER — Ambulatory Visit (INDEPENDENT_AMBULATORY_CARE_PROVIDER_SITE_OTHER): Payer: BLUE CROSS/BLUE SHIELD

## 2018-05-26 DIAGNOSIS — J309 Allergic rhinitis, unspecified: Secondary | ICD-10-CM | POA: Diagnosis not present

## 2018-06-03 ENCOUNTER — Ambulatory Visit (INDEPENDENT_AMBULATORY_CARE_PROVIDER_SITE_OTHER): Payer: BLUE CROSS/BLUE SHIELD

## 2018-06-03 DIAGNOSIS — J309 Allergic rhinitis, unspecified: Secondary | ICD-10-CM

## 2018-06-09 ENCOUNTER — Ambulatory Visit (INDEPENDENT_AMBULATORY_CARE_PROVIDER_SITE_OTHER): Payer: BLUE CROSS/BLUE SHIELD

## 2018-06-09 DIAGNOSIS — J309 Allergic rhinitis, unspecified: Secondary | ICD-10-CM | POA: Diagnosis not present

## 2018-06-21 ENCOUNTER — Ambulatory Visit (INDEPENDENT_AMBULATORY_CARE_PROVIDER_SITE_OTHER): Payer: BLUE CROSS/BLUE SHIELD

## 2018-06-21 DIAGNOSIS — J309 Allergic rhinitis, unspecified: Secondary | ICD-10-CM

## 2018-06-30 ENCOUNTER — Ambulatory Visit (INDEPENDENT_AMBULATORY_CARE_PROVIDER_SITE_OTHER): Payer: BLUE CROSS/BLUE SHIELD

## 2018-06-30 DIAGNOSIS — J309 Allergic rhinitis, unspecified: Secondary | ICD-10-CM

## 2018-07-04 DIAGNOSIS — J301 Allergic rhinitis due to pollen: Secondary | ICD-10-CM | POA: Diagnosis not present

## 2018-07-04 NOTE — Progress Notes (Signed)
Vials exp 07-05-19 

## 2018-07-07 ENCOUNTER — Ambulatory Visit (INDEPENDENT_AMBULATORY_CARE_PROVIDER_SITE_OTHER): Payer: BLUE CROSS/BLUE SHIELD

## 2018-07-07 DIAGNOSIS — J309 Allergic rhinitis, unspecified: Secondary | ICD-10-CM

## 2018-07-19 ENCOUNTER — Ambulatory Visit (INDEPENDENT_AMBULATORY_CARE_PROVIDER_SITE_OTHER): Payer: BLUE CROSS/BLUE SHIELD

## 2018-07-19 DIAGNOSIS — J309 Allergic rhinitis, unspecified: Secondary | ICD-10-CM

## 2018-07-27 ENCOUNTER — Ambulatory Visit (INDEPENDENT_AMBULATORY_CARE_PROVIDER_SITE_OTHER): Payer: BLUE CROSS/BLUE SHIELD

## 2018-07-27 DIAGNOSIS — J309 Allergic rhinitis, unspecified: Secondary | ICD-10-CM | POA: Diagnosis not present

## 2018-08-04 ENCOUNTER — Ambulatory Visit (INDEPENDENT_AMBULATORY_CARE_PROVIDER_SITE_OTHER): Payer: BLUE CROSS/BLUE SHIELD

## 2018-08-04 DIAGNOSIS — J309 Allergic rhinitis, unspecified: Secondary | ICD-10-CM

## 2018-08-08 ENCOUNTER — Ambulatory Visit (INDEPENDENT_AMBULATORY_CARE_PROVIDER_SITE_OTHER): Payer: BLUE CROSS/BLUE SHIELD

## 2018-08-08 DIAGNOSIS — J309 Allergic rhinitis, unspecified: Secondary | ICD-10-CM | POA: Diagnosis not present

## 2018-08-10 ENCOUNTER — Encounter: Payer: Self-pay | Admitting: Family Medicine

## 2018-08-10 ENCOUNTER — Ambulatory Visit: Payer: BLUE CROSS/BLUE SHIELD | Admitting: Family Medicine

## 2018-08-10 VITALS — BP 124/100 | HR 84 | Temp 97.7°F | Resp 20 | Ht 72.5 in | Wt 241.6 lb

## 2018-08-10 DIAGNOSIS — J0110 Acute frontal sinusitis, unspecified: Secondary | ICD-10-CM | POA: Diagnosis not present

## 2018-08-10 DIAGNOSIS — J330 Polyp of nasal cavity: Secondary | ICD-10-CM

## 2018-08-10 DIAGNOSIS — B999 Unspecified infectious disease: Secondary | ICD-10-CM | POA: Insufficient documentation

## 2018-08-10 DIAGNOSIS — J4541 Moderate persistent asthma with (acute) exacerbation: Secondary | ICD-10-CM

## 2018-08-10 DIAGNOSIS — J3089 Other allergic rhinitis: Secondary | ICD-10-CM | POA: Diagnosis not present

## 2018-08-10 DIAGNOSIS — K219 Gastro-esophageal reflux disease without esophagitis: Secondary | ICD-10-CM

## 2018-08-10 MED ORDER — OMEPRAZOLE 40 MG PO CPDR: 40.0000 mg | DELAYED_RELEASE_CAPSULE | Freq: Every day | ORAL | 5 refills | Status: DC

## 2018-08-10 MED ORDER — ALBUTEROL SULFATE 108 (90 BASE) MCG/ACT IN AEPB: 2.0000 | INHALATION_SPRAY | RESPIRATORY_TRACT | 0 refills | Status: DC | PRN

## 2018-08-10 MED ORDER — MONTELUKAST SODIUM 10 MG PO TABS: 10.0000 mg | ORAL_TABLET | Freq: Every day | ORAL | 5 refills | Status: DC

## 2018-08-10 MED ORDER — BUDESONIDE-FORMOTEROL FUMARATE 160-4.5 MCG/ACT IN AERO: INHALATION_SPRAY | RESPIRATORY_TRACT | 5 refills | Status: DC

## 2018-08-10 MED ORDER — AEROCHAMBER MV MISC: 2 refills | Status: AC

## 2018-08-10 MED ORDER — EPINEPHRINE 0.3 MG/0.3ML IJ SOAJ: INTRAMUSCULAR | 1 refills | Status: DC

## 2018-08-10 MED ORDER — FLUTICASONE PROPIONATE 93 MCG/ACT NA EXHU: 2.0000 | INHALANT_SUSPENSION | Freq: Two times a day (BID) | NASAL | 5 refills | Status: DC

## 2018-08-10 NOTE — Patient Instructions (Addendum)
Asthma with acute exacerbation Begin Symbicort 160-2 puffs twice a day with a spacer to prevent cough or wheeze Begin montelukast 10 mg once a day to prevent cough or wheeze ProAir 2 puffs every 4 hours as needed for cough or wheeze. You can use ProAir 5-15 minutes before exercise to decrease cough or wheeze  Acute frontal sinusitis, Prednisone 10 mg tablets. Take 2 tablets twice a day for 3 days, then take 2 tablets for 1 day, then take 1 tablet on the 5th day, then stop Continue with nasal saline rinses. Use this before any medicated nasal sprays Recheck pneumococcal titers. We will call you when these results are available  Perennial and seasonal allergic rhinitis Begin Xhance 2 sprays in each nostril twice a day For thick nasal drainage use (guaifenesin)  Mucinex 1200 mg twice a day. Nasal rinses as above Continue allergy injections in 1 week if feeling better.  Polyp of nasal cavity Continue Xhance nasal spray as above If no improvement in 4 weeks, consider reevaluation of nasal polyp by ENT specialist  Reflux Continue lifestyle modifications including elimination of caffeine, sodas, and chocolate. Continue omeprazole 40 mg once a day to control reflux. This may also decrease wheezing  Your blood pressure was elevated during the visit to the clinic today. Follow up with your primary care provider for your blood pressure  Follow up in the clinic in 4 weeks or sooner if needed

## 2018-08-10 NOTE — Progress Notes (Addendum)
[address] [place] [address] Dept: [phone]  FOLLOW UP NOTE  Patient ID: Christopher Medina, male    DOB: Jul 26, 1966  Age: 52 y.o. MRN: [id number] Date of Office Visit: 08/10/2018  Assessment  Chief Complaint: Nasal Congestion and Asthma (cough)  HPI Christopher Medina is a 52 year old male who presents to the clinic for a sick visit. He was last seen in this clinic on 11/22/2017 by Dr. Nunzio Cobbs for asthma with exacerbation and acute sinusitis. At that time, he required a prednisone taper for resolution. At today's visit, he reports that for the last 1.5 months he has experienced severe sinus congestion with anosmia, headaches, and nasal drainage that ranges from yellow to clear. He reports that he has experienced these symptoms frequently over the last several years with many episodes requiring antibiotics and prednisone. He reports that his symptoms cleared with Timmothy Sours and then he stoped taking the medicines. He saw Dr. 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  Asthma is reported as moderately well controlled with shortness of breath and wheeze  with activity. He reports that he has used Symbicort 160, montelukast 10 mg and Flovent 110 recently, however, he is currently out of all of these medicines. Reflux is reported as well controlled with omeprazole, however, he is out of this medication currently. He reports that he had a PPSV23 vaccination at his primary care provider over 1 year ago, however, he has not yet had postvaccination titers drawn at this time.     Drug Allergies:  No Known Allergies  Physical Exam: BP (!) 124/100 Comment: rechecked toward the end of the visit  Pulse 84   Temp 97.7 F (36.5 C) (Oral)   Resp 20   Ht 6' 0.5" (1.842 m)   Wt 241 lb 9.6 oz (109.6 kg)   SpO2 97%   BMI 32.32 kg/m     Physical Exam  Constitutional: He is oriented to person, place, and time. He appears well-developed and well-nourished.  HENT:  Head: Normocephalic.  Right Ear: External ear normal.  Left Ear: External ear normal.  Mouth/Throat: Oropharynx is clear and moist.  Bilateral nares erythematous and edematous with clear nasal drainage noted. Septal deviation noted. Pharynx normal. Ears normal. Eyes normal.   Eyes: Conjunctivae are normal.  Neck: Normal range of motion. Neck supple.  Cardiovascular: Normal rate, regular rhythm and normal heart sounds.  No murmur noted  Pulmonary/Chest: Effort normal and breath sounds normal.  Lungs clear to auscultation  Musculoskeletal: Normal range of motion.  Neurological: He is alert and oriented to person, place, and time.  Skin: Skin is warm and dry.  Psychiatric: He has a normal mood and affect. His behavior is normal. Judgment and thought content normal.  Vitals reviewed.   Diagnostics: FVC 2.61, FEV1 1.99. Predicted FVC 5.47, predicted FEV1 4.20. Spirometry indicates severe restriction.  Post bronchodilator therapy FVC 3.10, FEV1 2.36.  Post spirometry indicates moderate restriction with 19% improvement in FEV1 and 19% improvement in FVC.  Assessment and Plan: 1. Moderate persistent asthma with acute exacerbation   2. Acute frontal sinusitis, recurrence not specified   3. Perennial and seasonal allergic rhinitis   4. Polyp of nasal cavity   5. Recurrent infections   6. Gastroesophageal reflux disease, esophagitis presence not specified     Meds ordered this encounter  Medications  .  Fluticasone Propionate (XHANCE) 93 MCG/ACT EXHU    Sig: Place 2 sprays into the nose 2 (two) times daily.    Dispense:  32 mL    Refill:  5  . budesonide-formoterol (SYMBICORT) 160-4.5 MCG/ACT inhaler    Sig: Two puffs with spacer twice a day to prevent cough or wheeze.    Dispense:  1 Inhaler    Refill:  5  . EPINEPHrine (AUVI-Q) 0.3 mg/0.3 mL IJ SOAJ  injection    Sig: Use as directed for sever allergic reactions    Dispense:  4 Device    Refill:  1    Dispense 2 cartons of 2 devices each.  Please call patient on this # [phone]  . omeprazole (PRILOSEC) 40 MG capsule    Sig: Take 1 capsule (40 mg total) by mouth daily.    Dispense:  30 capsule    Refill:  5  . montelukast (SINGULAIR) 10 MG tablet    Sig: Take 1 tablet (10 mg total) by mouth at bedtime.    Dispense:  30 tablet    Refill:  5  . Spacer/Aero-Holding Chambers (AEROCHAMBER MV) inhaler    Sig: Use as instructed    Dispense:  1 each    Refill:  2  . DISCONTD: Albuterol Sulfate (PROAIR RESPICLICK) 108 (90 Base) MCG/ACT AEPB    Sig: Inhale 2 puffs into the lungs every 4 (four) hours as needed.    Dispense:  1 each    Refill:  0  . Albuterol Sulfate (PROAIR RESPICLICK) 108 (90 Base) MCG/ACT AEPB    Sig: Inhale 2 puffs into the lungs every 4 (four) hours as needed.    Dispense:  1 each    Refill:  0    Patient Instructions  Asthma with acute exacerbation Prednisone 10 mg tablets. Take 2 tablets twice a day for 3 days, then take 2 tablets for 1 day, then take 1 tablet on the 5th day, then stop Begin Symbicort 160-2 puffs twice a day with a spacer to prevent cough or wheeze Begin montelukast 10 mg once a day to prevent cough or wheeze ProAir 2 puffs every 4 hours as needed for cough or wheeze. You can use ProAir 5-15 minutes before exercise to decrease cough or wheeze  Acute frontal sinusitis, Prednisone has been provided (as above) Continue with nasal saline rinses. Use this before any medicated nasal sprays Recheck pneumococcal titers. We will call you when these results are available  Perennial and seasonal allergic rhinitis Begin Xhance 2 sprays in each nostril twice a day For thick nasal drainage use (guaifenesin)  Mucinex 1200 mg twice a day. Nasal rinses as above Continue allergy injections in 1 week if feeling better.  Polyp of nasal cavity Continue  Xhance nasal spray as above If no improvement in 4 weeks, consider reevaluation of nasal polyp by ENT specialist  Reflux Continue lifestyle modifications including elimination of caffeine, sodas, and chocolate. Continue omeprazole 40 mg once a day to control reflux. This may also decrease wheezing  Your blood pressure was elevated during the visit to the clinic today. Follow up with your primary care provider for your blood pressure  Follow up in the clinic in 4 weeks or sooner if needed  Return in about 4 weeks (around 09/07/2018), or if symptoms worsen or fail to improve.   Thank you for the opportunity to care for this patient.  Please do not hesitate to contact me with questions.  Thermon Leyland, FNP [facility]  _________________________________________________  I have provided oversight concerning [facility]'s evaluation and treatment of this patient's health issues addressed during today's encounter.  I agree with the assessment and therapeutic plan as outlined in the note.   Signed,   R Jorene Guest, MD

## 2018-08-24 NOTE — Progress Notes (Signed)
Vials exp 08-25-19 

## 2018-08-25 ENCOUNTER — Ambulatory Visit (INDEPENDENT_AMBULATORY_CARE_PROVIDER_SITE_OTHER): Payer: BLUE CROSS/BLUE SHIELD

## 2018-08-25 DIAGNOSIS — J309 Allergic rhinitis, unspecified: Secondary | ICD-10-CM

## 2018-08-29 DIAGNOSIS — J301 Allergic rhinitis due to pollen: Secondary | ICD-10-CM

## 2018-09-07 ENCOUNTER — Encounter: Payer: Self-pay | Admitting: Family Medicine

## 2018-09-07 ENCOUNTER — Ambulatory Visit: Payer: BLUE CROSS/BLUE SHIELD | Admitting: Family Medicine

## 2018-09-07 VITALS — BP 140/90 | HR 100 | Temp 97.7°F | Resp 20 | Ht 72.52 in | Wt 238.1 lb

## 2018-09-07 DIAGNOSIS — K219 Gastro-esophageal reflux disease without esophagitis: Secondary | ICD-10-CM

## 2018-09-07 DIAGNOSIS — J4540 Moderate persistent asthma, uncomplicated: Secondary | ICD-10-CM | POA: Insufficient documentation

## 2018-09-07 DIAGNOSIS — B999 Unspecified infectious disease: Secondary | ICD-10-CM | POA: Diagnosis not present

## 2018-09-07 DIAGNOSIS — J3089 Other allergic rhinitis: Secondary | ICD-10-CM

## 2018-09-07 DIAGNOSIS — J0101 Acute recurrent maxillary sinusitis: Secondary | ICD-10-CM

## 2018-09-07 DIAGNOSIS — Z8709 Personal history of other diseases of the respiratory system: Secondary | ICD-10-CM | POA: Diagnosis not present

## 2018-09-07 MED ORDER — AMOXICILLIN-POT CLAVULANATE 875-125 MG PO TABS: 1.0000 | ORAL_TABLET | Freq: Two times a day (BID) | ORAL | 0 refills | Status: AC

## 2018-09-07 NOTE — Progress Notes (Addendum)
[address] [place] [id number] Dept: [phone]  FOLLOW UP NOTE  Patient ID: Christopher PitmanDavid Medina, male    DOB: [birth year]  Age: 52 y.o. MRN: [id number] Date of Office Visit: 09/07/2018  Assessment  Chief Complaint: Nasal Congestion  HPI Christopher Medina is a 52 year old male who presents to the clinic for a follow up visit. He was last seen in this clinic on 08/10/2018 for evaluation of asthma, allergic rhinitis, acute sinusitits, nasal polyp, and reflux. At today's visit, he reports continuous nasal congestion that has worsened over the last 2 weeks with symptoms including thick green nasal drainage, headache, and pain in his teeth. He has not been able to smell for the last 2 years and continues to experience a decrease in sense of taste. He reports that he has been using Xhance 2 sprays twice a day and a daily for the last month as well as saline nasal rinse with no improvement of symptoms. The prednisone taper from his last visit, 4 weeks ago, helped for about 1-2 weeks and symptoms returned. He reports that he saw Dr Richardson Landryavid Moore ENT specialist in [place] about 2 years ago for evaluation of polyps. He continues allergen immunotherapy with no adverse reactions. Asthma is reported as significantly improved since his last visit. He continues Symbicort 160 and montelukast and seldom needs his albuterol inhaler. Reflux is reported as well controlled with lifestyle modifications and omeprazole 40 mg once a day. His current medications are listed in the chart.    Drug Allergies:  No Known Allergies  Physical Exam: BP 140/90 (BP Location: Left Arm, Patient Position: Sitting, Cuff Size: Large)   Pulse 100   Temp 97.7 F (36.5 C) (Oral)   Resp 20   Ht 6' 0.52" (1.842 m)   Wt 238 lb 1.6 oz (108 kg)   SpO2 99%   BMI 31.83 kg/m    Physical Exam  Constitutional: He is oriented to person, place, and time. He appears well-developed.  HENT:  Head: Normocephalic.  Right Ear: External ear  normal.  Left Ear: External ear normal.  Mouth/Throat: Oropharynx is clear and moist.  Bilateral nares erythematous and edematous with thick green drainage.  Pharynx normal.  Ears normal.  Eyes normal.  Eyes: Conjunctivae are normal.  Neck: Normal range of motion. Neck supple.  Cardiovascular: Normal rate, regular rhythm and normal heart sounds.  No murmur noted  Pulmonary/Chest: Effort normal and breath sounds normal.  Lungs clear to auscultation  Musculoskeletal: Normal range of motion.  Neurological: He is alert and oriented to person, place, and time.  Skin: Skin is warm and dry.  Psychiatric: He has a normal mood and affect. His behavior is normal. Judgment and thought content normal.  Vitals reviewed.   Diagnostics: FVC 5.41, FEV1 4.50.  Predicted FVC 5.47, predicted FEV1 4.20.  Spirometry is within the normal range.  Assessment and Plan: 1. Acute recurrent maxillary sinusitis   2. Moderate persistent asthma without complication   3. Recurrent infections   4. History of nasal polyp   5. Perennial and seasonal allergic rhinitis   6. Gastroesophageal reflux disease, esophagitis presence not specified     Meds ordered this encounter  Medications  . amoxicillin-clavulanate (AUGMENTIN) 875-125 MG tablet    Sig: Take 1 tablet by mouth 2 (two) times daily for 10 days.    Dispense:  20 tablet    Refill:  0    Patient Instructions  Acute frontal sinusitis, Prednisone 10 mg tablets. Take 2  tablets twice a day for 3 days, then take 2 tablets for 1 day, then take 1 tablet on the 5th day, then stop Begin Augmentin 875 mg twice a day for 10 days Continue with nasal saline rinses. Use this before any medicated nasal sprays We will refer you to ENT for further evaluation  Recurrent infections Recheck pneumococcal titers. We will call you when these results are available  Asthma  Continue Symbicort 160-2 puffs twice a day with a spacer to prevent cough or wheeze Continue  montelukast 10 mg once a day to prevent cough or wheeze ProAir 2 puffs every 4 hours as needed for cough or wheeze. You can use ProAir 5-15 minutes before exercise to decrease cough or wheeze  Perennial and seasonal allergic rhinitis Begin Xhance 2 sprays in each nostril twice a day For thick nasal drainage use (guaifenesin)  Mucinex 1200 mg twice a day. Nasal rinses as above Continue allergy injections in 1 week if feeling better.  Polyp of nasal cavity Continue Xhance nasal spray as above  Reflux Continue lifestyle modifications including elimination of caffeine, sodas, and chocolate. Continue omeprazole 40 mg once a day to control reflux. This may also decrease wheezing  Your blood pressure was elevated during the visit to the clinic today. Follow up with your primary care provider for your blood pressure  Follow up in the clinic in 4 weeks or sooner if needed   Return in about 4 weeks (around 10/05/2018), or if symptoms worsen or fail to improve.    Thank you for the opportunity to care for this patient.  Please do not hesitate to contact me with questions.  Thermon Leyland, FNP [facility]  _________________________________________________  I have provided oversight concerning [facility]'s evaluation and treatment of this patient's health issues addressed during today's encounter.  I agree with the assessment and therapeutic plan as outlined in the note.   Signed,   R Jorene Guest, MD

## 2018-09-07 NOTE — Patient Instructions (Addendum)
Acute frontal sinusitis, Prednisone 10 mg tablets. Take 2 tablets twice a day for 3 days, then take 2 tablets for 1 day, then take 1 tablet on the 5th day, then stop Begin Augmentin 875 mg twice a day for 10 days Continue with nasal saline rinses. Use this before any medicated nasal sprays We will refer you to ENT for further evaluation  Recurrent infections Recheck pneumococcal titers. We will call you when these results are available  Asthma  Continue Symbicort 160-2 puffs twice a day with a spacer to prevent cough or wheeze Continue montelukast 10 mg once a day to prevent cough or wheeze ProAir 2 puffs every 4 hours as needed for cough or wheeze. You can use ProAir 5-15 minutes before exercise to decrease cough or wheeze  Perennial and seasonal allergic rhinitis Begin Xhance 2 sprays in each nostril twice a day For thick nasal drainage use (guaifenesin)  Mucinex 1200 mg twice a day. Nasal rinses as above Continue allergy injections in 1 week if feeling better.  Polyp of nasal cavity Continue Xhance nasal spray as above  Reflux Continue lifestyle modifications including elimination of caffeine, sodas, and chocolate. Continue omeprazole 40 mg once a day to control reflux. This may also decrease wheezing  Your blood pressure was elevated during the visit to the clinic today. Follow up with your primary care provider for your blood pressure  Follow up in the clinic in 4 weeks or sooner if needed

## 2018-09-12 NOTE — Progress Notes (Signed)
Patient is set up to see Dr Christell ConstantMoore on 09/20/2018 @ 8:30. Patient has been informed, notes have been faxed.  Thanks

## 2018-09-20 ENCOUNTER — Other Ambulatory Visit: Payer: Self-pay | Admitting: Allergy and Immunology

## 2018-09-21 ENCOUNTER — Ambulatory Visit (INDEPENDENT_AMBULATORY_CARE_PROVIDER_SITE_OTHER): Payer: BLUE CROSS/BLUE SHIELD

## 2018-09-21 DIAGNOSIS — J309 Allergic rhinitis, unspecified: Secondary | ICD-10-CM

## 2018-09-29 ENCOUNTER — Ambulatory Visit (INDEPENDENT_AMBULATORY_CARE_PROVIDER_SITE_OTHER): Payer: BLUE CROSS/BLUE SHIELD

## 2018-09-29 DIAGNOSIS — J309 Allergic rhinitis, unspecified: Secondary | ICD-10-CM

## 2018-10-10 ENCOUNTER — Ambulatory Visit: Payer: BLUE CROSS/BLUE SHIELD | Admitting: Family Medicine

## 2018-10-17 ENCOUNTER — Ambulatory Visit (INDEPENDENT_AMBULATORY_CARE_PROVIDER_SITE_OTHER): Payer: BLUE CROSS/BLUE SHIELD

## 2018-10-17 DIAGNOSIS — J309 Allergic rhinitis, unspecified: Secondary | ICD-10-CM

## 2018-11-02 ENCOUNTER — Ambulatory Visit (INDEPENDENT_AMBULATORY_CARE_PROVIDER_SITE_OTHER): Payer: BLUE CROSS/BLUE SHIELD

## 2018-11-02 DIAGNOSIS — J309 Allergic rhinitis, unspecified: Secondary | ICD-10-CM

## 2018-11-24 ENCOUNTER — Ambulatory Visit (INDEPENDENT_AMBULATORY_CARE_PROVIDER_SITE_OTHER): Payer: BLUE CROSS/BLUE SHIELD

## 2018-11-24 DIAGNOSIS — J309 Allergic rhinitis, unspecified: Secondary | ICD-10-CM | POA: Diagnosis not present

## 2018-11-29 ENCOUNTER — Telehealth: Payer: Self-pay

## 2018-11-29 NOTE — Telephone Encounter (Signed)
Prior authorization for Christopher Medina has been approved and sent to the pharmacy.   CaseId:[id number];Status:Approved;Review Type:Prior Auth;Coverage Start Date:10/30/2018;Coverage End Date:11/29/2019

## 2018-12-08 ENCOUNTER — Ambulatory Visit (INDEPENDENT_AMBULATORY_CARE_PROVIDER_SITE_OTHER): Payer: BLUE CROSS/BLUE SHIELD

## 2018-12-08 DIAGNOSIS — J309 Allergic rhinitis, unspecified: Secondary | ICD-10-CM | POA: Diagnosis not present

## 2018-12-22 ENCOUNTER — Ambulatory Visit (INDEPENDENT_AMBULATORY_CARE_PROVIDER_SITE_OTHER): Payer: BLUE CROSS/BLUE SHIELD | Admitting: *Deleted

## 2018-12-22 DIAGNOSIS — J309 Allergic rhinitis, unspecified: Secondary | ICD-10-CM

## 2018-12-28 ENCOUNTER — Ambulatory Visit (INDEPENDENT_AMBULATORY_CARE_PROVIDER_SITE_OTHER): Payer: BLUE CROSS/BLUE SHIELD

## 2018-12-28 DIAGNOSIS — J309 Allergic rhinitis, unspecified: Secondary | ICD-10-CM | POA: Diagnosis not present

## 2019-03-05 IMAGING — DX DG CHEST 2V
2 series · 2 of 2 positions shown · non-contrast
Comparison: None.

CLINICAL DATA: Asthma exacerbation.

EXAM:
CHEST  2 VIEW

[chest pa]
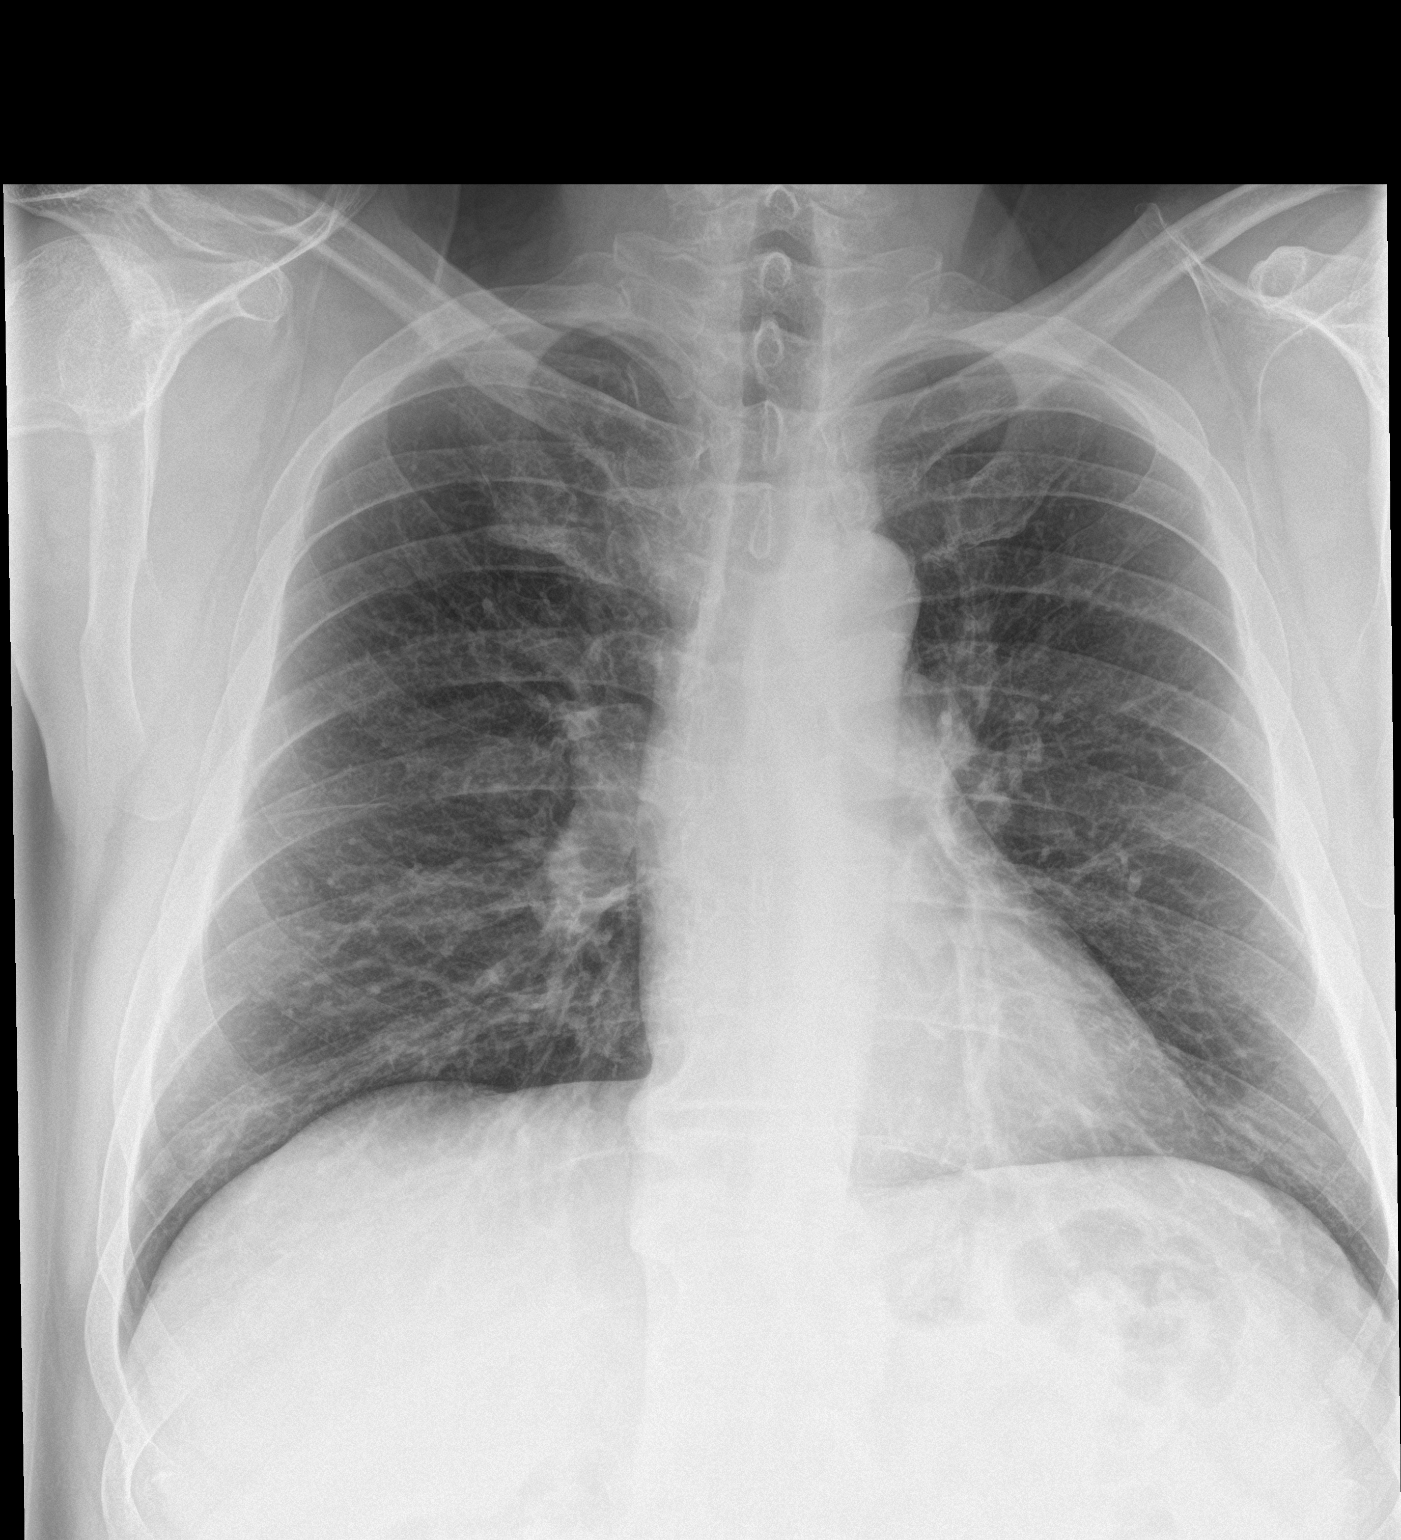

[chest lat]
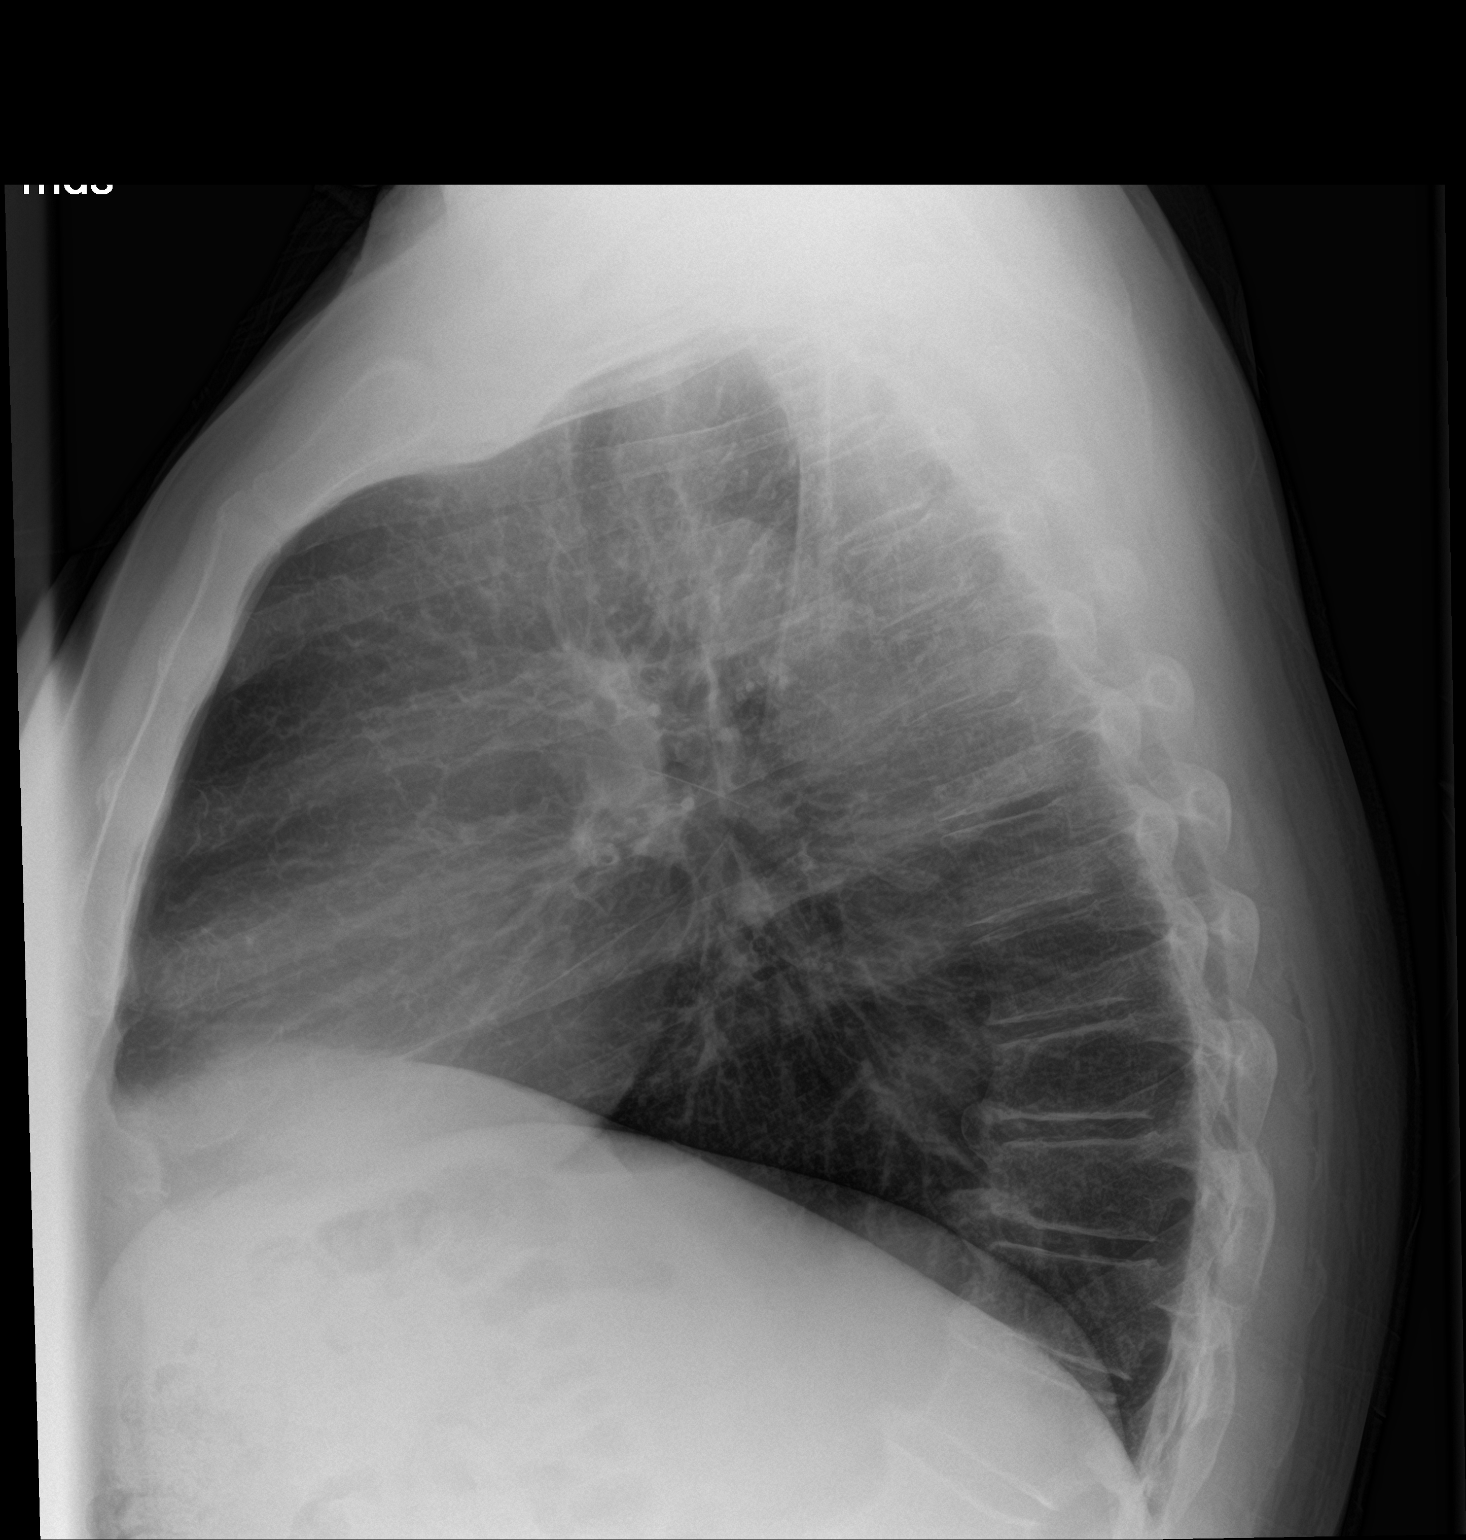

[2 of 2 positions shown; findings below may reference images not displayed]

FINDINGS: The lungs are clear without focal pneumonia, edema, pneumothorax or
pleural effusion. The cardiopericardial silhouette is within normal
limits for size. The visualized bony structures of the thorax are
intact.
IMPRESSION: No active cardiopulmonary disease.

## 2019-05-01 ENCOUNTER — Other Ambulatory Visit: Payer: Self-pay | Admitting: Family Medicine

## 2020-01-01 ENCOUNTER — Ambulatory Visit: Payer: BC Managed Care – PPO

## 2020-01-05 ENCOUNTER — Ambulatory Visit: Payer: BC Managed Care – PPO | Attending: Internal Medicine

## 2020-01-05 DIAGNOSIS — Z23 Encounter for immunization: Secondary | ICD-10-CM

## 2020-01-05 NOTE — Progress Notes (Signed)
   Covid-19 Vaccination Clinic  Name:  Ronith Berti    MRN: [id number] DOB: 06/24/1966  01/05/2020  Mr. Irizarry was observed post Covid-19 immunization for 15 minutes without incident. He was provided with Vaccine Information Sheet and instruction to access the V-Safe system.   Mr. Bellucci was instructed to call 911 with any severe reactions post vaccine: Marland Kitchen Difficulty breathing  . Swelling of face and throat  . A fast heartbeat  . A bad rash all over body  . Dizziness and weakness   Immunizations Administered    Name Date Dose VIS Date Route   Pfizer COVID-19 Vaccine 01/05/2020  8:14 AM 0.3 mL 09/22/2019 Intramuscular   Manufacturer: [employer], [employer]   Lot: [id number]   NDC: [address]-4

## 2020-01-30 ENCOUNTER — Ambulatory Visit: Payer: BC Managed Care – PPO | Attending: Internal Medicine

## 2020-01-30 DIAGNOSIS — Z23 Encounter for immunization: Secondary | ICD-10-CM

## 2020-01-30 NOTE — Progress Notes (Signed)
   Covid-19 Vaccination Clinic  Name:  Christopher Medina    MRN: [id number] DOB: [birth year]  01/30/2020  [name] was observed post Covid-19 immunization for 15 minutes without incident. He was provided with Vaccine Information Sheet and instruction to access the V-Safe system.   [name] was instructed to call 911 with any severe reactions post vaccine: Marland Kitchen Difficulty breathing  . Swelling of face and throat  . A fast heartbeat  . A bad rash all over body  . Dizziness and weakness   Immunizations Administered    Name Date Dose VIS Date Route   Pfizer COVID-19 Vaccine 01/30/2020  2:12 PM 0.3 mL 12/06/2018 Intramuscular   Manufacturer: [employer], [employer]   Lot: [id number]   NDC: [address]-5

## 2020-03-06 ENCOUNTER — Other Ambulatory Visit: Payer: Self-pay | Admitting: *Deleted

## 2020-03-06 MED ORDER — MONTELUKAST SODIUM 10 MG PO TABS: 10.0000 mg | ORAL_TABLET | Freq: Every day | ORAL | 0 refills | Status: DC

## 2020-03-06 MED ORDER — BUDESONIDE-FORMOTEROL FUMARATE 160-4.5 MCG/ACT IN AERO: INHALATION_SPRAY | RESPIRATORY_TRACT | 0 refills | Status: DC

## 2020-03-06 MED ORDER — OMEPRAZOLE 40 MG PO CPDR: 40.0000 mg | DELAYED_RELEASE_CAPSULE | Freq: Every day | ORAL | 0 refills | Status: DC

## 2020-03-06 MED ORDER — PROAIR RESPICLICK 108 (90 BASE) MCG/ACT IN AEPB: 2.0000 | INHALATION_SPRAY | RESPIRATORY_TRACT | 0 refills | Status: DC | PRN

## 2020-03-07 ENCOUNTER — Other Ambulatory Visit: Payer: Self-pay

## 2020-03-14 ENCOUNTER — Other Ambulatory Visit: Payer: Self-pay

## 2020-03-14 ENCOUNTER — Ambulatory Visit: Payer: BC Managed Care – PPO | Admitting: Allergy and Immunology

## 2020-03-14 ENCOUNTER — Encounter: Payer: Self-pay | Admitting: Allergy and Immunology

## 2020-03-14 VITALS — BP 140/90 | HR 72 | Temp 97.7°F | Resp 18 | Ht 73.3 in | Wt 240.8 lb

## 2020-03-14 DIAGNOSIS — K219 Gastro-esophageal reflux disease without esophagitis: Secondary | ICD-10-CM | POA: Diagnosis not present

## 2020-03-14 DIAGNOSIS — J3089 Other allergic rhinitis: Secondary | ICD-10-CM | POA: Diagnosis not present

## 2020-03-14 DIAGNOSIS — J4540 Moderate persistent asthma, uncomplicated: Secondary | ICD-10-CM | POA: Diagnosis not present

## 2020-03-14 DIAGNOSIS — H1013 Acute atopic conjunctivitis, bilateral: Secondary | ICD-10-CM

## 2020-03-14 DIAGNOSIS — J330 Polyp of nasal cavity: Secondary | ICD-10-CM | POA: Diagnosis not present

## 2020-03-14 MED ORDER — CARBINOXAMINE MALEATE 6 MG PO TABS: ORAL_TABLET | ORAL | 3 refills | Status: DC

## 2020-03-14 MED ORDER — OMEPRAZOLE 40 MG PO CPDR: DELAYED_RELEASE_CAPSULE | ORAL | 3 refills | Status: DC

## 2020-03-14 MED ORDER — XHANCE 93 MCG/ACT NA EXHU: 2.0000 | INHALANT_SUSPENSION | Freq: Two times a day (BID) | NASAL | 5 refills | Status: DC

## 2020-03-14 MED ORDER — MONTELUKAST SODIUM 10 MG PO TABS: 10.0000 mg | ORAL_TABLET | Freq: Every day | ORAL | 3 refills | Status: DC

## 2020-03-14 MED ORDER — ALBUTEROL SULFATE HFA 108 (90 BASE) MCG/ACT IN AERS
2.0000 | INHALATION_SPRAY | RESPIRATORY_TRACT | 1 refills | Status: DC | PRN
Start: 2020-03-14 — End: 2021-03-04

## 2020-03-14 MED ORDER — BUDESONIDE-FORMOTEROL FUMARATE 160-4.5 MCG/ACT IN AERO: INHALATION_SPRAY | RESPIRATORY_TRACT | 3 refills | Status: DC

## 2020-03-14 NOTE — Assessment & Plan Note (Signed)
   For now, continue Symbicort 160-4.5 g, 2 inhalations via spacer device twice daily.  Continue montelukast 10 mg daily at bedtime.  Continue albuterol HFA, 1 to 2 inhalations every 4-6 hours if needed.  Subjective and objective measures of pulmonary function will be followed and the treatment plan will be adjusted accordingly.

## 2020-03-14 NOTE — Progress Notes (Signed)
VIALS EXP 03-14-21 

## 2020-03-14 NOTE — Patient Instructions (Addendum)
Perennial and seasonal allergic rhinitis Aeroallergen avoidance measures have been discussed and provided in written form.  Continue montelukast 10 mg daily at bedtime.  Continue Xhance as prescribed.  Nasal saline spray (i.e. Simply Saline) is recommended prior to medicated nasal sprays and as needed.  A prescription has been provided for RyVent (carbinoxamine maleate) 6mg  every 6-8 hours as needed.  Restart immunotherapy injections.  Moderate persistent asthma  For now, continue Symbicort 160-4.5 g, 2 inhalations via spacer device twice daily.  Continue montelukast 10 mg daily at bedtime.  Continue albuterol HFA, 1 to 2 inhalations every 4-6 hours if needed.  Subjective and objective measures of pulmonary function will be followed and the treatment plan will be adjusted accordingly.  Polyp of nasal cavity  Continue Xhance daily to prevent polyp recurrence.  Gastroesophageal reflux disease  Continue appropriate reflux lifestyle modifications.  For now, continue omeprazole 40 mg daily, 30 minutes prior to breakfast.   Return in about 3 months (around 06/14/2020), or if symptoms worsen or fail to improve.  Control of Dust Mite Allergen  House dust mites play a major role in allergic asthma and rhinitis.  They occur in environments with high humidity wherever human skin, the food for dust mites is found. High levels have been detected in dust obtained from mattresses, pillows, carpets, upholstered furniture, bed covers, clothes and soft toys.  The principal allergen of the house dust mite is found in its feces.  A gram of dust may contain 1,000 mites and 250,000 fecal particles.  Mite antigen is easily measured in the air during house cleaning activities.    1. Encase mattresses, including the box spring, and pillow, in an air tight cover.  Seal the zipper end of the encased mattresses with wide adhesive tape. 2. Wash the bedding in water of 130 degrees Farenheit weekly.  Avoid  cotton comforters/quilts and flannel bedding: the most ideal bed covering is the dacron comforter. 3. Remove all upholstered furniture from the bedroom. 4. Remove carpets, carpet padding, rugs, and non-washable window drapes from the bedroom.  Wash drapes weekly or use plastic window coverings. 5. Remove all non-washable stuffed toys from the bedroom.  Wash stuffed toys weekly. 6. Have the room cleaned frequently with a vacuum cleaner and a damp dust-mop.  The patient should not be in a room which is being cleaned and should wait 1 hour after cleaning before going into the room. 7. Close and seal all heating outlets in the bedroom.  Otherwise, the room will become filled with dust-laden air.  An electric heater can be used to heat the room. Reduce indoor humidity to less than 50%.  Do not use a humidifier.   Reducing Pollen Exposure  The American Academy of Allergy, Asthma and Immunology suggests the following steps to reduce your exposure to pollen during allergy seasons.    1. Do not hang sheets or clothing out to dry; pollen may collect on these items. 2. Do not mow lawns or spend time around freshly cut grass; mowing stirs up pollen. 3. Keep windows closed at night.  Keep car windows closed while driving. 4. Minimize morning activities outdoors, a time when pollen counts are usually at their highest. 5. Stay indoors as much as possible when pollen counts or humidity is high and on windy days when pollen tends to remain in the air longer. 6. Use air conditioning when possible.  Many air conditioners have filters that trap the pollen spores. 7. Use a HEPA room air filter  to remove pollen form the indoor air you breathe.   Control of Mold Allergen  Mold and fungi can grow on a variety of surfaces provided certain temperature and moisture conditions exist.  Outdoor molds grow on plants, decaying vegetation and soil.  The major outdoor mold, Alternaria and Cladosporium, are found in very high  numbers during hot and dry conditions.  Generally, a late Summer - Fall peak is seen for common outdoor fungal spores.  Rain will temporarily lower outdoor mold spore count, but counts rise rapidly when the rainy period ends.  The most important indoor molds are Aspergillus and Penicillium.  Dark, humid and poorly ventilated basements are ideal sites for mold growth.  The next most common sites of mold growth are the bathroom and the kitchen.  Outdoor [employer] 1. Use air conditioning and keep windows closed 2. Avoid exposure to decaying vegetation. 3. Avoid leaf raking. 4. Avoid grain handling. 5. Consider wearing a face mask if working in moldy areas.  Indoor Mold Control 1. Maintain humidity below 50%. 2. Clean washable surfaces with 5% bleach solution. 3. Remove sources e.g. Contaminated carpets.   Control of Cockroach Allergen  Cockroach allergen has been identified as an important cause of acute attacks of asthma, especially in urban settings.  There are fifty-five species of cockroach that exist in the [place], however only three, the [place], [place] species produce allergen that can affect patients with Asthma.  Allergens can be obtained from fecal particles, egg casings and secretions from cockroaches.    1. Remove food sources. 2. Reduce access to water. 3. Seal access and entry points. 4. Spray runways with 0.5-1% Diazinon or Chlorpyrifos 5. Blow boric acid power under stoves and refrigerator. 6. Place bait stations (hydramethylnon) at feeding sites.

## 2020-03-14 NOTE — Progress Notes (Signed)
Follow-up Note  RE: Christopher Medina MRN: [id number] DOB: 10-Jun-1966 Date of Office Visit: 03/14/2020  Primary care provider: Mammie Lorenzo, FNP Referring provider: Mammie Lorenzo, FNP  History of present illness: Christopher Medina is a 54 y.o. male with allergic rhinoconjunctivitis and mild persistent asthma presenting today for follow-up and allergy skin testing.  He was last seen in this clinic in November 2019.  He had been receiving aero allergen immunotherapy injections with significant symptom reduction, however discontinued in March 2020 because of concerns of COVID-19 exposure while coming in for the injections.  He reports that his asthma and allergy symptoms had been well controlled until approximately 4 to 6 weeks ago when he began to experience increased nasal symptoms as well as wheezing and increased albuterol requirement.  He takes montelukast 10 mg daily but had discontinued Symbicort while on the allergy injections.  He needed to restart the Symbicort approximately 2 weeks ago and has not required albuterol rescue since that time.  He had nasal polypectomy in January 2020 with improved nasal airflow.  He continues to take [place] daily.  He reports that his reflux is well controlled with omeprazole 40 mg daily.  He had tried and failed omeprazole 20 mg daily.  Assessment and plan: Perennial and seasonal allergic rhinitis Aeroallergen avoidance measures have been discussed and provided in written form.  Continue montelukast 10 mg daily at bedtime.  Continue Xhance as prescribed.  Nasal saline spray (i.e. Simply Saline) is recommended prior to medicated nasal sprays and as needed.  A prescription has been provided for RyVent (carbinoxamine maleate) 6mg  every 6-8 hours as needed.  Restart immunotherapy injections.  Moderate persistent asthma  For now, continue Symbicort 160-4.5 g, 2 inhalations via spacer device twice daily.  Continue montelukast 10 mg daily at  bedtime.  Continue albuterol HFA, 1 to 2 inhalations every 4-6 hours if needed.  Subjective and objective measures of pulmonary function will be followed and the treatment plan will be adjusted accordingly.  Polyp of nasal cavity  Continue Xhance daily to prevent polyp recurrence.  Gastroesophageal reflux disease  Continue appropriate reflux lifestyle modifications.  For now, continue omeprazole 40 mg daily, 30 minutes prior to breakfast.   Meds ordered this encounter  Medications  . Carbinoxamine Maleate (RYVENT) 6 MG TABS    Sig: 1 tablet every 6-8 hours as needed    Dispense:  90 tablet    Refill:  3  . Fluticasone Propionate (XHANCE) 93 MCG/ACT EXHU    Sig: Place 2 sprays into the nose 2 (two) times daily.    Dispense:  32 mL    Refill:  5  . montelukast (SINGULAIR) 10 MG tablet    Sig: Take 1 tablet (10 mg total) by mouth at bedtime.    Dispense:  90 tablet    Refill:  3    Dispense 90 days  . budesonide-formoterol (SYMBICORT) 160-4.5 MCG/ACT inhaler    Sig: Two puffs with spacer twice a day to prevent cough or wheeze.    Dispense:  3 Inhaler    Refill:  3    Dispense 90 days  . albuterol (PROAIR HFA) 108 (90 Base) MCG/ACT inhaler    Sig: Inhale 2 puffs into the lungs every 4 (four) hours as needed for wheezing or shortness of breath.    Dispense:  54 g    Refill:  1    Dispense 90 days  . omeprazole (PRILOSEC) 40 MG capsule    Sig: 1 tablet 30 minutes  prior to breakfast    Dispense:  90 capsule    Refill:  3    Dispense 90 days    Diagnostics: Spirometry:  Normal with an FEV1 of 94% predicted. This study was performed while the patient was asymptomatic.  Please see scanned spirometry results for details. Environmental skin testing: Positive to grass pollen, weed pollen, ragweed pollen, tree pollen, molds, cockroach antigen, and dust mite antigen.   Physical examination: Blood pressure 140/90, pulse 72, temperature 97.7 F (36.5 C), temperature source  Oral, resp. rate 18, height 6' 1.3" (1.862 m), weight 240 lb 12.8 oz (109.2 kg), SpO2 97 %.  General: Alert, interactive, in no acute distress. HEENT: TMs pearly gray, turbinates moderately edematous with thick discharge, post-pharynx moderately erythematous. Neck: Supple without lymphadenopathy. Lungs: Clear to auscultation without wheezing, rhonchi or rales. CV: Normal S1, S2 without murmurs. Skin: Warm and dry, without lesions or rashes.  The following portions of the patient's history were reviewed and updated as appropriate: allergies, current medications, past family history, past medical history, past social history, past surgical history and problem list.  Current Outpatient Medications  Medication Sig Dispense Refill  . Albuterol Sulfate (PROAIR RESPICLICK) 108 (90 Base) MCG/ACT AEPB Inhale 2 puffs into the lungs every 4 (four) hours as needed. 1 each 0  . budesonide-formoterol (SYMBICORT) 160-4.5 MCG/ACT inhaler Two puffs with spacer twice a day to prevent cough or wheeze. 3 Inhaler 3  . ibuprofen (ADVIL,MOTRIN) 200 MG tablet Take 200 mg by mouth every 6 (six) hours as needed.    . montelukast (SINGULAIR) 10 MG tablet Take 1 tablet (10 mg total) by mouth at bedtime. 90 tablet 3  . omeprazole (PRILOSEC) 40 MG capsule Take 1 capsule (40 mg total) by mouth daily. 30 capsule 0  . Spacer/Aero-Holding Chambers (AEROCHAMBER MV) inhaler Use as instructed 1 each 2  . albuterol (PROAIR HFA) 108 (90 Base) MCG/ACT inhaler Inhale 2 puffs into the lungs every 4 (four) hours as needed for wheezing or shortness of breath. 54 g 1  . Carbinoxamine Maleate (RYVENT) 6 MG TABS 1 tablet every 6-8 hours as needed 90 tablet 3  . EPINEPHrine (AUVI-Q) 0.3 mg/0.3 mL IJ SOAJ injection Use as directed for sever allergic reactions (Patient not taking: Reported on 03/14/2020) 4 Device 1  . Fluticasone Propionate (XHANCE) 93 MCG/ACT EXHU Place 2 sprays into the nose 2 (two) times daily. 32 mL 5  . levocetirizine  (XYZAL) 5 MG tablet TAKE ONE TABLET BY MOUTH EVERY MORNING (Patient not taking: Reported on 03/14/2020) 30 tablet 5  . NON FORMULARY ALLERGEN IMMUNOTHERAPY    . omeprazole (PRILOSEC) 40 MG capsule 1 tablet 30 minutes prior to breakfast 90 capsule 3   No current facility-administered medications for this visit.    No Known Allergies  Review of systems: Review of systems negative except as noted in HPI / PMHx.  Past Medical History:  Diagnosis Date  . Asthma with acute exacerbation   . Recurrent upper respiratory infection (URI)     Family History  Problem Relation Age of Onset  . Allergic rhinitis Mother   . Allergic rhinitis Sister   . Angioedema Paternal Grandmother     Social History   Socioeconomic History  . Marital status: Married    Spouse name: Not on file  . Number of children: Not on file  . Years of education: Not on file  . Highest education level: Not on file  Occupational History  . Not on file  Tobacco Use  .  Smoking status: Never Smoker  . Smokeless tobacco: Never Used  Substance and Sexual Activity  . Alcohol use: No  . Drug use: No  . Sexual activity: Not on file  Other Topics Concern  . Not on file  Social History Narrative  . Not on file   Social Determinants of Health   Financial Resource Strain:   . Difficulty of Paying Living Expenses:   Food Insecurity:   . Worried About Programme researcher, broadcasting/film/video in the Last Year:   . Barista in the Last Year:   Transportation Needs:   . Freight forwarder (Medical):   Marland Kitchen Lack of Transportation (Non-Medical):   Physical Activity:   . Days of Exercise per Week:   . Minutes of Exercise per Session:   Stress:   . Feeling of Stress :   Social Connections:   . Frequency of Communication with Friends and Family:   . Frequency of Social Gatherings with Friends and Family:   . Attends Religious Services:   . Active Member of Clubs or Organizations:   . Attends Banker Meetings:   Marland Kitchen  Marital Status:   Intimate Partner Violence:   . Fear of Current or Ex-Partner:   . Emotionally Abused:   Marland Kitchen Physically Abused:   . Sexually Abused:     I appreciate the opportunity to take part in Kadyn's care. Please do not hesitate to contact me with questions.  Sincerely,   R. Jorene Guest, MD

## 2020-03-14 NOTE — Assessment & Plan Note (Addendum)
   Continue Xhance daily to prevent polyp recurrence.

## 2020-03-14 NOTE — Assessment & Plan Note (Signed)
   Continue appropriate reflux lifestyle modifications.  For now, continue omeprazole 40 mg daily, 30 minutes prior to breakfast.

## 2020-03-14 NOTE — Assessment & Plan Note (Addendum)
Aeroallergen avoidance measures have been discussed and provided in written form.  Continue montelukast 10 mg daily at bedtime.  Continue Xhance as prescribed.  Nasal saline spray (i.e. Simply Saline) is recommended prior to medicated nasal sprays and as needed.  A prescription has been provided for RyVent (carbinoxamine maleate) 6mg  every 6-8 hours as needed.  Restart immunotherapy injections.

## 2020-03-18 DIAGNOSIS — J301 Allergic rhinitis due to pollen: Secondary | ICD-10-CM | POA: Diagnosis not present

## 2020-03-19 DIAGNOSIS — J3089 Other allergic rhinitis: Secondary | ICD-10-CM

## 2020-03-21 ENCOUNTER — Telehealth: Payer: Self-pay

## 2020-03-21 NOTE — Telephone Encounter (Signed)
PA for Xhance was initiated through [email or web address] awaiting approval 

## 2020-03-21 NOTE — Telephone Encounter (Signed)
Christopher Medina has been approved through [email or web address]\ CaseId:[id number];Status:Approved;Review Type:Prior Auth;Coverage Start Date:02/20/2020;Coverage End Date:03/21/2021;

## 2020-04-02 ENCOUNTER — Other Ambulatory Visit: Payer: Self-pay

## 2020-04-02 MED ORDER — BUDESONIDE-FORMOTEROL FUMARATE 160-4.5 MCG/ACT IN AERO: INHALATION_SPRAY | RESPIRATORY_TRACT | 3 refills | Status: DC

## 2020-04-02 MED ORDER — OMEPRAZOLE 40 MG PO CPDR: DELAYED_RELEASE_CAPSULE | ORAL | 3 refills | Status: DC

## 2020-04-02 MED ORDER — MONTELUKAST SODIUM 10 MG PO TABS: 10.0000 mg | ORAL_TABLET | Freq: Every day | ORAL | 3 refills | Status: DC

## 2020-04-03 ENCOUNTER — Other Ambulatory Visit: Payer: Self-pay

## 2020-04-03 ENCOUNTER — Encounter: Payer: Self-pay | Admitting: Allergy

## 2020-04-03 ENCOUNTER — Ambulatory Visit (INDEPENDENT_AMBULATORY_CARE_PROVIDER_SITE_OTHER): Payer: BC Managed Care – PPO

## 2020-04-03 DIAGNOSIS — J309 Allergic rhinitis, unspecified: Secondary | ICD-10-CM | POA: Diagnosis not present

## 2020-04-03 NOTE — Progress Notes (Signed)
Immunotherapy   Patient Details  Name: Perfecto Purdy MRN: [id number] Date of Birth: 12-07-1965  04/03/2020  Ginette Pitman restarted injections (Blue 1:100,000- M-DM-CR and G-W-T) Following schedule: B  Frequency:1 time per week Epi-Pen:Epi-Pen Available  Consent signed and patient instructions given.   Clydell Alberts 04/03/2020, 10:26 AM

## 2020-04-18 ENCOUNTER — Ambulatory Visit (INDEPENDENT_AMBULATORY_CARE_PROVIDER_SITE_OTHER): Payer: BC Managed Care – PPO

## 2020-04-18 DIAGNOSIS — J309 Allergic rhinitis, unspecified: Secondary | ICD-10-CM | POA: Diagnosis not present

## 2020-04-22 ENCOUNTER — Other Ambulatory Visit: Payer: Self-pay | Admitting: Allergy and Immunology

## 2020-04-23 ENCOUNTER — Other Ambulatory Visit: Payer: Self-pay | Admitting: Allergy and Immunology

## 2020-04-24 ENCOUNTER — Ambulatory Visit (INDEPENDENT_AMBULATORY_CARE_PROVIDER_SITE_OTHER): Payer: BC Managed Care – PPO

## 2020-04-24 DIAGNOSIS — J309 Allergic rhinitis, unspecified: Secondary | ICD-10-CM

## 2020-05-07 ENCOUNTER — Ambulatory Visit (INDEPENDENT_AMBULATORY_CARE_PROVIDER_SITE_OTHER): Payer: BC Managed Care – PPO

## 2020-05-07 DIAGNOSIS — J309 Allergic rhinitis, unspecified: Secondary | ICD-10-CM | POA: Diagnosis not present

## 2020-05-13 ENCOUNTER — Ambulatory Visit (INDEPENDENT_AMBULATORY_CARE_PROVIDER_SITE_OTHER): Payer: BC Managed Care – PPO

## 2020-05-13 DIAGNOSIS — J309 Allergic rhinitis, unspecified: Secondary | ICD-10-CM

## 2020-05-20 ENCOUNTER — Ambulatory Visit (INDEPENDENT_AMBULATORY_CARE_PROVIDER_SITE_OTHER): Payer: BC Managed Care – PPO

## 2020-05-20 DIAGNOSIS — J309 Allergic rhinitis, unspecified: Secondary | ICD-10-CM

## 2020-05-29 ENCOUNTER — Ambulatory Visit (INDEPENDENT_AMBULATORY_CARE_PROVIDER_SITE_OTHER): Payer: BC Managed Care – PPO | Admitting: *Deleted

## 2020-05-29 DIAGNOSIS — J309 Allergic rhinitis, unspecified: Secondary | ICD-10-CM

## 2020-06-04 ENCOUNTER — Ambulatory Visit (INDEPENDENT_AMBULATORY_CARE_PROVIDER_SITE_OTHER): Payer: BC Managed Care – PPO

## 2020-06-04 DIAGNOSIS — J309 Allergic rhinitis, unspecified: Secondary | ICD-10-CM

## 2020-06-18 ENCOUNTER — Ambulatory Visit (INDEPENDENT_AMBULATORY_CARE_PROVIDER_SITE_OTHER): Payer: BC Managed Care – PPO

## 2020-06-18 DIAGNOSIS — J309 Allergic rhinitis, unspecified: Secondary | ICD-10-CM

## 2020-07-02 ENCOUNTER — Ambulatory Visit (INDEPENDENT_AMBULATORY_CARE_PROVIDER_SITE_OTHER): Payer: BC Managed Care – PPO

## 2020-07-02 DIAGNOSIS — J309 Allergic rhinitis, unspecified: Secondary | ICD-10-CM | POA: Diagnosis not present

## 2020-07-09 ENCOUNTER — Ambulatory Visit (INDEPENDENT_AMBULATORY_CARE_PROVIDER_SITE_OTHER): Payer: BC Managed Care – PPO

## 2020-07-09 DIAGNOSIS — J309 Allergic rhinitis, unspecified: Secondary | ICD-10-CM

## 2020-07-17 ENCOUNTER — Ambulatory Visit (INDEPENDENT_AMBULATORY_CARE_PROVIDER_SITE_OTHER): Payer: BC Managed Care – PPO

## 2020-07-17 DIAGNOSIS — J309 Allergic rhinitis, unspecified: Secondary | ICD-10-CM | POA: Diagnosis not present

## 2020-07-24 ENCOUNTER — Ambulatory Visit (INDEPENDENT_AMBULATORY_CARE_PROVIDER_SITE_OTHER): Payer: BC Managed Care – PPO

## 2020-07-24 DIAGNOSIS — J309 Allergic rhinitis, unspecified: Secondary | ICD-10-CM | POA: Diagnosis not present

## 2020-07-30 ENCOUNTER — Ambulatory Visit (INDEPENDENT_AMBULATORY_CARE_PROVIDER_SITE_OTHER): Payer: BC Managed Care – PPO

## 2020-07-30 DIAGNOSIS — J309 Allergic rhinitis, unspecified: Secondary | ICD-10-CM | POA: Diagnosis not present

## 2020-08-13 ENCOUNTER — Ambulatory Visit (INDEPENDENT_AMBULATORY_CARE_PROVIDER_SITE_OTHER): Payer: BC Managed Care – PPO

## 2020-08-13 DIAGNOSIS — J309 Allergic rhinitis, unspecified: Secondary | ICD-10-CM | POA: Diagnosis not present

## 2020-08-22 ENCOUNTER — Ambulatory Visit (INDEPENDENT_AMBULATORY_CARE_PROVIDER_SITE_OTHER): Payer: BC Managed Care – PPO

## 2020-08-22 DIAGNOSIS — J309 Allergic rhinitis, unspecified: Secondary | ICD-10-CM | POA: Diagnosis not present

## 2020-08-27 ENCOUNTER — Ambulatory Visit (INDEPENDENT_AMBULATORY_CARE_PROVIDER_SITE_OTHER): Payer: BC Managed Care – PPO

## 2020-08-27 DIAGNOSIS — J309 Allergic rhinitis, unspecified: Secondary | ICD-10-CM | POA: Diagnosis not present

## 2020-09-03 ENCOUNTER — Ambulatory Visit (INDEPENDENT_AMBULATORY_CARE_PROVIDER_SITE_OTHER): Payer: BC Managed Care – PPO | Admitting: *Deleted

## 2020-09-03 DIAGNOSIS — J309 Allergic rhinitis, unspecified: Secondary | ICD-10-CM | POA: Diagnosis not present

## 2020-09-13 ENCOUNTER — Ambulatory Visit (INDEPENDENT_AMBULATORY_CARE_PROVIDER_SITE_OTHER): Payer: BC Managed Care – PPO

## 2020-09-13 DIAGNOSIS — J309 Allergic rhinitis, unspecified: Secondary | ICD-10-CM

## 2020-09-17 ENCOUNTER — Other Ambulatory Visit: Payer: Self-pay

## 2020-09-17 MED ORDER — XHANCE 93 MCG/ACT NA EXHU: 2.0000 | INHALANT_SUSPENSION | Freq: Two times a day (BID) | NASAL | 0 refills | Status: DC

## 2020-09-17 NOTE — Telephone Encounter (Signed)
Courtesy refill sent in  

## 2020-09-25 ENCOUNTER — Ambulatory Visit (INDEPENDENT_AMBULATORY_CARE_PROVIDER_SITE_OTHER): Payer: BC Managed Care – PPO

## 2020-09-25 DIAGNOSIS — J309 Allergic rhinitis, unspecified: Secondary | ICD-10-CM | POA: Diagnosis not present

## 2020-10-01 ENCOUNTER — Ambulatory Visit (INDEPENDENT_AMBULATORY_CARE_PROVIDER_SITE_OTHER): Payer: BC Managed Care – PPO

## 2020-10-01 DIAGNOSIS — J309 Allergic rhinitis, unspecified: Secondary | ICD-10-CM | POA: Diagnosis not present

## 2020-10-09 ENCOUNTER — Ambulatory Visit (INDEPENDENT_AMBULATORY_CARE_PROVIDER_SITE_OTHER): Payer: BC Managed Care – PPO

## 2020-10-09 DIAGNOSIS — J309 Allergic rhinitis, unspecified: Secondary | ICD-10-CM

## 2020-10-17 ENCOUNTER — Ambulatory Visit (INDEPENDENT_AMBULATORY_CARE_PROVIDER_SITE_OTHER): Payer: BC Managed Care – PPO

## 2020-10-17 DIAGNOSIS — J309 Allergic rhinitis, unspecified: Secondary | ICD-10-CM

## 2020-10-22 ENCOUNTER — Ambulatory Visit (INDEPENDENT_AMBULATORY_CARE_PROVIDER_SITE_OTHER): Payer: BC Managed Care – PPO | Admitting: *Deleted

## 2020-10-22 DIAGNOSIS — J309 Allergic rhinitis, unspecified: Secondary | ICD-10-CM

## 2020-11-08 ENCOUNTER — Ambulatory Visit (INDEPENDENT_AMBULATORY_CARE_PROVIDER_SITE_OTHER): Payer: BC Managed Care – PPO | Admitting: *Deleted

## 2020-11-08 ENCOUNTER — Telehealth: Payer: Self-pay | Admitting: *Deleted

## 2020-11-08 DIAGNOSIS — J309 Allergic rhinitis, unspecified: Secondary | ICD-10-CM

## 2020-11-08 NOTE — Telephone Encounter (Signed)
Christopher Medina says he tried to pick up his Symbicort and is now $300/month. He was paying $60/month. Lyla Son checked with [facility] and it is $300 for 1 month supply or $400 for a 3 month supply. [facility] stated that either Breo or Advair would be cheaper options. Please advise.

## 2020-11-12 MED ORDER — ADVAIR HFA 230-21 MCG/ACT IN AERO
2.0000 | INHALATION_SPRAY | Freq: Two times a day (BID) | RESPIRATORY_TRACT | 5 refills | Status: DC
Start: 2020-11-12 — End: 2020-11-12

## 2020-11-12 MED ORDER — ADVAIR HFA 230-21 MCG/ACT IN AERO: 2.0000 | INHALATION_SPRAY | Freq: Two times a day (BID) | RESPIRATORY_TRACT | 5 refills | Status: DC

## 2020-11-12 NOTE — Telephone Encounter (Signed)
Yes

## 2020-11-12 NOTE — Telephone Encounter (Signed)
Is Advair HFA one of less expensive options?

## 2020-11-12 NOTE — Telephone Encounter (Signed)
Advair HFA 230/21 mcg, 2 puffs bid via spacer device. Thanks.

## 2020-11-12 NOTE — Telephone Encounter (Signed)
Pt informed of the switch

## 2020-11-14 ENCOUNTER — Ambulatory Visit (INDEPENDENT_AMBULATORY_CARE_PROVIDER_SITE_OTHER): Payer: BC Managed Care – PPO

## 2020-11-14 DIAGNOSIS — J309 Allergic rhinitis, unspecified: Secondary | ICD-10-CM | POA: Diagnosis not present

## 2020-11-20 ENCOUNTER — Ambulatory Visit (INDEPENDENT_AMBULATORY_CARE_PROVIDER_SITE_OTHER): Payer: BC Managed Care – PPO

## 2020-11-20 DIAGNOSIS — J309 Allergic rhinitis, unspecified: Secondary | ICD-10-CM | POA: Diagnosis not present

## 2020-11-27 ENCOUNTER — Ambulatory Visit (INDEPENDENT_AMBULATORY_CARE_PROVIDER_SITE_OTHER): Payer: BC Managed Care – PPO

## 2020-11-27 DIAGNOSIS — J309 Allergic rhinitis, unspecified: Secondary | ICD-10-CM

## 2020-12-04 ENCOUNTER — Ambulatory Visit (INDEPENDENT_AMBULATORY_CARE_PROVIDER_SITE_OTHER): Payer: BC Managed Care – PPO

## 2020-12-04 DIAGNOSIS — J309 Allergic rhinitis, unspecified: Secondary | ICD-10-CM | POA: Diagnosis not present

## 2020-12-11 ENCOUNTER — Ambulatory Visit (INDEPENDENT_AMBULATORY_CARE_PROVIDER_SITE_OTHER): Payer: BC Managed Care – PPO

## 2020-12-11 DIAGNOSIS — J309 Allergic rhinitis, unspecified: Secondary | ICD-10-CM | POA: Diagnosis not present

## 2020-12-17 ENCOUNTER — Ambulatory Visit (INDEPENDENT_AMBULATORY_CARE_PROVIDER_SITE_OTHER): Payer: BC Managed Care – PPO

## 2020-12-17 DIAGNOSIS — J309 Allergic rhinitis, unspecified: Secondary | ICD-10-CM

## 2020-12-17 NOTE — Progress Notes (Signed)
VIALS EXP 12-17-21 

## 2020-12-18 DIAGNOSIS — J3089 Other allergic rhinitis: Secondary | ICD-10-CM

## 2020-12-25 ENCOUNTER — Ambulatory Visit (INDEPENDENT_AMBULATORY_CARE_PROVIDER_SITE_OTHER): Payer: BC Managed Care – PPO | Admitting: *Deleted

## 2020-12-25 DIAGNOSIS — J309 Allergic rhinitis, unspecified: Secondary | ICD-10-CM | POA: Diagnosis not present

## 2020-12-31 ENCOUNTER — Ambulatory Visit (INDEPENDENT_AMBULATORY_CARE_PROVIDER_SITE_OTHER): Payer: BC Managed Care – PPO

## 2020-12-31 DIAGNOSIS — J309 Allergic rhinitis, unspecified: Secondary | ICD-10-CM

## 2021-01-07 ENCOUNTER — Ambulatory Visit (INDEPENDENT_AMBULATORY_CARE_PROVIDER_SITE_OTHER): Payer: BC Managed Care – PPO

## 2021-01-07 DIAGNOSIS — J309 Allergic rhinitis, unspecified: Secondary | ICD-10-CM

## 2021-01-14 ENCOUNTER — Ambulatory Visit (INDEPENDENT_AMBULATORY_CARE_PROVIDER_SITE_OTHER): Payer: BC Managed Care – PPO

## 2021-01-14 DIAGNOSIS — J309 Allergic rhinitis, unspecified: Secondary | ICD-10-CM | POA: Diagnosis not present

## 2021-01-21 ENCOUNTER — Ambulatory Visit (INDEPENDENT_AMBULATORY_CARE_PROVIDER_SITE_OTHER): Payer: BC Managed Care – PPO

## 2021-01-21 DIAGNOSIS — J309 Allergic rhinitis, unspecified: Secondary | ICD-10-CM

## 2021-01-27 ENCOUNTER — Other Ambulatory Visit: Payer: Self-pay

## 2021-01-27 MED ORDER — OMEPRAZOLE 40 MG PO CPDR: DELAYED_RELEASE_CAPSULE | ORAL | 0 refills | Status: DC

## 2021-02-04 ENCOUNTER — Ambulatory Visit (INDEPENDENT_AMBULATORY_CARE_PROVIDER_SITE_OTHER): Payer: BC Managed Care – PPO

## 2021-02-04 DIAGNOSIS — J309 Allergic rhinitis, unspecified: Secondary | ICD-10-CM

## 2021-02-12 ENCOUNTER — Ambulatory Visit (INDEPENDENT_AMBULATORY_CARE_PROVIDER_SITE_OTHER): Payer: BC Managed Care – PPO

## 2021-02-12 DIAGNOSIS — J309 Allergic rhinitis, unspecified: Secondary | ICD-10-CM

## 2021-02-18 ENCOUNTER — Ambulatory Visit (INDEPENDENT_AMBULATORY_CARE_PROVIDER_SITE_OTHER): Payer: BC Managed Care – PPO

## 2021-02-18 DIAGNOSIS — J309 Allergic rhinitis, unspecified: Secondary | ICD-10-CM | POA: Diagnosis not present

## 2021-02-25 ENCOUNTER — Ambulatory Visit (INDEPENDENT_AMBULATORY_CARE_PROVIDER_SITE_OTHER): Payer: BC Managed Care – PPO

## 2021-02-25 DIAGNOSIS — J309 Allergic rhinitis, unspecified: Secondary | ICD-10-CM

## 2021-03-02 ENCOUNTER — Other Ambulatory Visit: Payer: Self-pay | Admitting: Family Medicine

## 2021-03-03 ENCOUNTER — Ambulatory Visit (INDEPENDENT_AMBULATORY_CARE_PROVIDER_SITE_OTHER): Payer: BC Managed Care – PPO

## 2021-03-03 DIAGNOSIS — J309 Allergic rhinitis, unspecified: Secondary | ICD-10-CM

## 2021-03-03 NOTE — Patient Instructions (Addendum)
Moderate persistent asthma Continue Advair HFA 230/21 mcg using 2 puffs twice a day with spacer to help prevent cough and wheeze Continue montelukast 10 mg at night to help prevent cough and wheeze May use albuterol 2 puffs every 4-6 hours as needed for cough, wheeze, tightness in chest, or shortness of breath.  Perennial and seasonal allergic rhinitis Continue allergy injections per protocol and have access to epinephrine autoinjector device Continue montelukast 10 mg at night Continue Xhance 2 sprays each nostril twice a day May use saline nasal spray as needed for nasal symptoms.  Use this prior to any medicated nasal sprays.  Polyp of nasal cavity Continue Xhance 2 sprays each nostril twice a day to prevent polyp recurrence  Gastroesophageal reflux disease Continue lifestyle and dietary modifications Continue omeprazole 40 mg once a day 30 minutes prior to breakfast  Please let us know if this treatment plan is not working well for you Schedule a follow-up appointment in 4 months or sooner if needed.

## 2021-03-04 ENCOUNTER — Encounter: Payer: Self-pay | Admitting: Family

## 2021-03-04 ENCOUNTER — Ambulatory Visit: Payer: BC Managed Care – PPO | Admitting: Family

## 2021-03-04 ENCOUNTER — Other Ambulatory Visit: Payer: Self-pay

## 2021-03-04 VITALS — BP 124/78 | HR 81 | Temp 97.9°F | Resp 20

## 2021-03-04 DIAGNOSIS — J3089 Other allergic rhinitis: Secondary | ICD-10-CM | POA: Diagnosis not present

## 2021-03-04 DIAGNOSIS — K219 Gastro-esophageal reflux disease without esophagitis: Secondary | ICD-10-CM

## 2021-03-04 DIAGNOSIS — J330 Polyp of nasal cavity: Secondary | ICD-10-CM

## 2021-03-04 DIAGNOSIS — J4540 Moderate persistent asthma, uncomplicated: Secondary | ICD-10-CM | POA: Diagnosis not present

## 2021-03-04 DIAGNOSIS — J302 Other seasonal allergic rhinitis: Secondary | ICD-10-CM

## 2021-03-04 MED ORDER — ADVAIR HFA 230-21 MCG/ACT IN AERO: 2.0000 | INHALATION_SPRAY | Freq: Two times a day (BID) | RESPIRATORY_TRACT | 5 refills | Status: DC

## 2021-03-04 MED ORDER — MONTELUKAST SODIUM 10 MG PO TABS: 10.0000 mg | ORAL_TABLET | Freq: Every day | ORAL | 1 refills | Status: DC

## 2021-03-04 MED ORDER — XHANCE 93 MCG/ACT NA EXHU: 2.0000 | INHALANT_SUSPENSION | Freq: Two times a day (BID) | NASAL | 2 refills | Status: DC

## 2021-03-04 MED ORDER — OMEPRAZOLE 40 MG PO CPDR: DELAYED_RELEASE_CAPSULE | ORAL | 5 refills | Status: DC

## 2021-03-04 MED ORDER — ALBUTEROL SULFATE HFA 108 (90 BASE) MCG/ACT IN AERS: 2.0000 | INHALATION_SPRAY | RESPIRATORY_TRACT | 0 refills | Status: DC | PRN

## 2021-03-04 NOTE — Progress Notes (Signed)
[address] [place] [address] Dept: [phone]  FOLLOW UP NOTE  Patient ID: Christopher Medina, male    DOB: 1965/11/01  Age: 55 y.o. MRN: [id number] Date of Office Visit: 03/04/2021  Assessment  Chief Complaint: Follow-up and Medication Refill  HPI Christopher Medina is a 55 year old male who presents today for follow-up of perennial and seasonal allergic rhinitis, moderate persistent asthma, polyp of nasal cavity, and gastroesophageal reflux disease.  He was last seen on March 14, 2020 by Dr. Nunzio Cobbs.  Perennial and seasonal allergic rhinitis is reported as controlled with montelukast 10 mg once a day, Xhance 2 sprays each nostril twice a day,and environmental allergy injections per protocol.  He is not using carbinoxamine.  He feels that his allergy injections have helped a whole lot and he denies any large local reactions.  He denies any rhinorrhea, nasal congestion, and postnasal drip.  He has not had any sinus infections since we last saw him.  Moderate persistent asthma is reported as moderately controlled with Advair HFA 230/21 mcg, montelukast 10 mg once a day, and albuterol as needed.  He reports that he ran out of his Advair HFA 230/21 mcg for a week and a half and did not pick up this medication and re-started this medication yesterday.  During that time he reports that he had some wheezing and congestion in his chest.  He denies coughing, tightness in his chest, shortness of breath, and nocturnal awakenings.  Since his last office visit he has not required any systemic steroids or made any trips to the emergency room or urgent care due to breathing problems.  In January 2020 he had nasal polypectomy and he continues to use [place] daily.  He denies any nasal congestion.  Reflux is reported as controlled with omeprazole 40 mg once a day.   Drug Allergies:  No Known Allergies  Review of Systems: Review of Systems  Constitutional: Negative for chills and fever.  HENT:        Denies rhinorrhea, nasal congestion, and post nasal drip  Eyes:       Denies itchy watery eyes  Respiratory: Positive for wheezing. Negative for cough and shortness of breath.        Reports that he was out of his Advair HFA 230/21 mcg for a week and a half and had a Graeff bit of wheezing and congestion.  Yesterday he was able to get his refill of his Advair HFA  Cardiovascular: Negative for chest pain and palpitations.  Gastrointestinal: Negative for heartburn.  Genitourinary: Negative for dysuria.  Skin: Negative for itching and rash.  Neurological: Negative for headaches.  Endo/Heme/Allergies: Positive for environmental allergies.    Physical Exam: BP 124/78 (BP Location: Right Arm, Patient Position: Sitting)   Pulse 81   Temp 97.9 F (36.6 C) (Temporal)   Resp 20   SpO2 97%    Physical Exam Constitutional:      Appearance: Normal appearance.  HENT:     Head: Normocephalic and atraumatic.     Comments: Pharynx normal, eyes normal, ears normal, nose bilateral lower turbinate mildly edematous with no drainage noted    Right Ear: Tympanic membrane, ear canal and external ear normal.     Left Ear: Tympanic membrane, ear canal and external ear normal.     Mouth/Throat:     Mouth: Mucous membranes are moist.     Pharynx: Oropharynx is clear.  Eyes:     Conjunctiva/sclera: Conjunctivae normal.  Cardiovascular:  Rate and Rhythm: Regular rhythm.     Heart sounds: Normal heart sounds.  Pulmonary:     Effort: Pulmonary effort is normal.     Comments: Wheezing heard in bilateral lower lobes.  Lungs clear in other lobes. Post bronchodilator response: lungs clear to auscultation Musculoskeletal:     Cervical back: Neck supple.  Skin:    General: Skin is warm.  Neurological:     Mental Status: He is alert and oriented to person, place, and time.  Psychiatric:        Mood and Affect: Mood normal.        Behavior: Behavior normal.        Thought Content: Thought content normal.         Judgment: Judgment normal.     Diagnostics: Pre-spirometry: unable to document due to data entry error.  Post bronchodilator response shows FVC 4.47 L, FEV1 3.77 L.  Spirometry indicates normal ventilatory function.  He reports symptomatic improvement past 4 puffs of Xopenex.  Assessment and Plan: 1. Moderate persistent asthma, unspecified whether complicated   2. Seasonal and perennial allergic rhinitis   3. Gastroesophageal reflux disease, unspecified whether esophagitis present   4. Polyp of nasal cavity     Meds ordered this encounter  Medications  . albuterol (PROAIR HFA) 108 (90 Base) MCG/ACT inhaler    Sig: Inhale 2 puffs into the lungs every 4 (four) hours as needed for wheezing or shortness of breath.    Dispense:  54 g    Refill:  0    Dispense 90 day supply  . Fluticasone Propionate (XHANCE) 93 MCG/ACT EXHU    Sig: Place 2 sprays into the nose 2 (two) times daily.    Dispense:  32 mL    Refill:  2  . fluticasone-salmeterol (ADVAIR HFA) 230-21 MCG/ACT inhaler    Sig: Inhale 2 puffs into the lungs 2 (two) times daily.    Dispense:  1 each    Refill:  5  . montelukast (SINGULAIR) 10 MG tablet    Sig: Take 1 tablet (10 mg total) by mouth at bedtime.    Dispense:  90 tablet    Refill:  1    Dispense 90 days  . omeprazole (PRILOSEC) 40 MG capsule    Sig: TAKE 1 CAPSULE BY MOUTH 30 MINUTES BEFORE BREAKFAST,normal    Dispense:  30 capsule    Refill:  5    Patient Instructions  Moderate persistent asthma Continue Advair HFA 230/21 mcg using 2 puffs twice a day with spacer to help prevent cough and wheeze Continue montelukast 10 mg at night to help prevent cough and wheeze May use albuterol 2 puffs every 4-6 hours as needed for cough, wheeze, tightness in chest, or shortness of breath.  Perennial and seasonal allergic rhinitis Continue allergy injections per protocol and have access to epinephrine autoinjector device Continue montelukast 10 mg at  night Continue Xhance 2 sprays each nostril twice a day May use saline nasal spray as needed for nasal symptoms.  Use this prior to any medicated nasal sprays.  Polyp of nasal cavity Continue Xhance 2 sprays each nostril twice a day to prevent polyp recurrence  Gastroesophageal reflux disease Continue lifestyle and dietary modifications Continue omeprazole 40 mg once a day 30 minutes prior to breakfast  Please let us know if this treatment plan is not working well for you Schedule a follow-up appointment in 4 months or sooner if needed.    Return in about 4 months (  around 07/05/2021), or if symptoms worsen or fail to improve.    Thank you for the opportunity to care for this patient.  Please do not hesitate to contact me with questions.  Nehemiah Settle, FNP [facility]

## 2021-03-13 DIAGNOSIS — J302 Other seasonal allergic rhinitis: Secondary | ICD-10-CM

## 2021-03-13 NOTE — Progress Notes (Signed)
VIALS MADE. EXP 03-13-22 

## 2021-03-19 ENCOUNTER — Ambulatory Visit (INDEPENDENT_AMBULATORY_CARE_PROVIDER_SITE_OTHER): Payer: BC Managed Care – PPO

## 2021-03-19 DIAGNOSIS — J309 Allergic rhinitis, unspecified: Secondary | ICD-10-CM | POA: Diagnosis not present

## 2021-03-30 ENCOUNTER — Other Ambulatory Visit: Payer: Self-pay | Admitting: Family Medicine

## 2021-04-17 ENCOUNTER — Other Ambulatory Visit: Payer: Self-pay

## 2021-04-17 MED ORDER — MONTELUKAST SODIUM 10 MG PO TABS: 10.0000 mg | ORAL_TABLET | Freq: Every day | ORAL | 0 refills | Status: DC

## 2021-08-24 ENCOUNTER — Other Ambulatory Visit: Payer: Self-pay | Admitting: Family Medicine

## 2021-09-24 ENCOUNTER — Telehealth: Payer: Self-pay | Admitting: *Deleted

## 2021-09-24 MED ORDER — OMEPRAZOLE 40 MG PO CPDR
40.0000 mg | DELAYED_RELEASE_CAPSULE | Freq: Every day | ORAL | 2 refills | Status: DC
Start: 2021-09-24 — End: 2021-12-02

## 2021-09-24 MED ORDER — XHANCE 93 MCG/ACT NA EXHU: 2.0000 | INHALANT_SUSPENSION | Freq: Two times a day (BID) | NASAL | 2 refills | Status: DC

## 2021-09-24 MED ORDER — MONTELUKAST SODIUM 10 MG PO TABS: 10.0000 mg | ORAL_TABLET | Freq: Every day | ORAL | 2 refills | Status: DC

## 2021-09-24 MED ORDER — ALBUTEROL SULFATE HFA 108 (90 BASE) MCG/ACT IN AERS: 2.0000 | INHALATION_SPRAY | RESPIRATORY_TRACT | 1 refills | Status: DC | PRN

## 2021-09-24 NOTE — Telephone Encounter (Signed)
Pt called needing refills on omeprazole, montelukast and albuterol sent to [facility] and xhance to [facility]. I informed him he needed an office visit. He can not come in until February because his wife will be having surgery in January. Appt has been scheduled with Chrissie, FNP and courtesy refills have been sent in.   Pt new cell number is [phone].

## 2021-09-25 ENCOUNTER — Telehealth: Payer: Self-pay | Admitting: *Deleted

## 2021-09-25 NOTE — Telephone Encounter (Signed)
PA has been submitted through CoverMyMeds for [facility] and has been approved. PA has been faxed to patients pharmacy, labeled, and placed in bulk scanning.

## 2021-11-30 NOTE — Patient Instructions (Addendum)
Moderate persistent asthma Stop Advair HFA 230/21 and or Symbicort Start AirDuo 113/ 14 mcg 1 puff twice a day to help prevent cough and wheeze. This medication will come from a specialty pharmacy and not your local pharmacy. Call and let us know if this medication is too expensive Continue montelukast 10 mg at night to help prevent cough and wheeze May use albuterol 2 puffs every 4-6 hours as needed for cough, wheeze, tightness in chest, or shortness of breath. Make sure and use this medication as needed rather than daily  Perennial and seasonal allergic rhinitis Continue allergy injections per protocol and have access to epinephrine autoinjector device Continue montelukast 10 mg at night Continue Xhance 2 sprays each nostril twice a day May use saline nasal spray as needed for nasal symptoms.  Use this prior to any medicated nasal sprays.  Polyp of nasal cavity Continue Xhance 2 sprays each nostril twice a day to prevent polyp recurrence  Gastroesophageal reflux disease Continue lifestyle and dietary modifications Continue omeprazole 40 mg once a day 30 minutes prior to breakfast  Please let us know if this treatment plan is not working well for you Schedule a follow-up appointment in 6 weeks or sooner if needed.

## 2021-12-02 ENCOUNTER — Encounter: Payer: Self-pay | Admitting: Family

## 2021-12-02 ENCOUNTER — Ambulatory Visit (INDEPENDENT_AMBULATORY_CARE_PROVIDER_SITE_OTHER): Payer: BC Managed Care – PPO | Admitting: Family

## 2021-12-02 ENCOUNTER — Other Ambulatory Visit: Payer: Self-pay

## 2021-12-02 VITALS — BP 110/80 | HR 75 | Temp 98.1°F | Resp 17

## 2021-12-02 DIAGNOSIS — J4540 Moderate persistent asthma, uncomplicated: Secondary | ICD-10-CM | POA: Diagnosis not present

## 2021-12-02 DIAGNOSIS — K219 Gastro-esophageal reflux disease without esophagitis: Secondary | ICD-10-CM | POA: Diagnosis not present

## 2021-12-02 DIAGNOSIS — J3089 Other allergic rhinitis: Secondary | ICD-10-CM | POA: Diagnosis not present

## 2021-12-02 DIAGNOSIS — J330 Polyp of nasal cavity: Secondary | ICD-10-CM | POA: Diagnosis not present

## 2021-12-02 DIAGNOSIS — J302 Other seasonal allergic rhinitis: Secondary | ICD-10-CM

## 2021-12-02 MED ORDER — AIRDUO DIGIHALER 113-14 MCG/ACT IN AEPB: INHALATION_SPRAY | RESPIRATORY_TRACT | 2 refills | Status: DC

## 2021-12-02 MED ORDER — OMEPRAZOLE 40 MG PO CPDR: DELAYED_RELEASE_CAPSULE | ORAL | 1 refills | Status: DC

## 2021-12-02 MED ORDER — MONTELUKAST SODIUM 10 MG PO TABS: 10.0000 mg | ORAL_TABLET | Freq: Every day | ORAL | 1 refills | Status: DC

## 2021-12-02 NOTE — Progress Notes (Signed)
[place] [address] Dept: [phone]  FOLLOW UP NOTE  Patient ID: Christopher Medina, male    DOB: [birth year]  Age: 56 y.o. MRN: [id number] Date of Office Visit: 12/02/2021  Assessment  Chief Complaint: Asthma  HPI Christopher Medina is a 56 year old male who presents today for follow-up of moderate persistent asthma, seasonal and perennial allergic rhinitis, gastroesophageal reflux disease, and polyp of nasal cavity.  He was last seen on Mar 04, 2021 by Althea Charon, FNP.  Since his last office visit he denies any new diagnosis or surgeries.  Moderate persistent asthma is reported as not well controlled with montelukast 10 mg at night and albuterol as needed.  He reports that he was switched to albuterol rather than Symbicort when the price went from $60 to $240.  Instructed that his last office visit had him using Advair HFA 230/21 mcg.  He then wonders if the pharmacy is mixed up his medications.  He reports Michaelis bit of productive cough with clear mucus and denies fever, chills, wheezing, tightness in chest, shortness of breath, and nocturnal awakenings due to breathing problems.  Since his last office visit he has not required any systemic steroids or made any trips to the emergency room or urgent care due to breathing problems.  He has been using his albuterol twice a day since maybe his last appointment.  He reports that he is not sure when he started using albuterol rather than a daily inhaler.  Perennial and seasonal allergic rhinitis is reported as controlled with Singulair 10 mg once a day and Xhance 2 sprays each nostril twice a day.  He quit getting allergy injections due to the expense and insurance not covering him.  He also mentions that his job moved to [place] and if he needed to restart allergy injections he can get them in our [place] office.  He denies rhinorrhea, nasal congestion, and postnasal drip.  He has not had any sinus infections since we last saw  him.  He continues to use Xhance 2 sprays each nostril twice a day for polyp of nasal cavity.  He reports he is able to taste and not able to smell a lot.  He reports that he can smell strong odors.  He reports that the ENT said to him that he may or may not get his sense of smell back.  Gastroesophageal reflux disease is reported as controlled with omeprazole 40 mg once a day.     Drug Allergies:  No Known Allergies  Review of Systems: Review of Systems  Constitutional:  Negative for chills and fever.  HENT:         Denies rhinorrhea, nasal congestion, and postnasal drip.  Reports that he is able to taste but can only smell real strong odors  Eyes:        Denies itchy watery eyes  Respiratory:  Positive for cough. Negative for shortness of breath and wheezing.        Reports productive cough with clear sputum and denies wheezing, tightness in chest, shortness of breath, and nocturnal awakenings due to breathing problems  Cardiovascular:  Negative for chest pain and palpitations.  Gastrointestinal:        Denies heartburn or reflux symptoms as long as he takes omeprazole 40 mg daily  Genitourinary:  Negative for frequency.  Skin:  Negative for itching and rash.  Neurological:  Positive for headaches.  Endo/Heme/Allergies:  Positive for environmental allergies.    Physical Exam: BP  110/80    Pulse 75    Temp 98.1 F (36.7 C) (Temporal)    Resp 17    SpO2 97%    Physical Exam Constitutional:      Appearance: Normal appearance.  HENT:     Head: Normocephalic and atraumatic.     Comments: Pharynx normal, eyes normal, ears normal, nose normal    Right Ear: Tympanic membrane, ear canal and external ear normal.     Left Ear: Tympanic membrane, ear canal and external ear normal.     Nose: Nose normal.     Mouth/Throat:     Mouth: Mucous membranes are moist.     Pharynx: Oropharynx is clear.  Eyes:     Conjunctiva/sclera: Conjunctivae normal.  Cardiovascular:     Rate and  Rhythm: Regular rhythm.     Heart sounds: Normal heart sounds.  Pulmonary:     Effort: Pulmonary effort is normal.     Comments: Rhonchi and wheezing heard throughout all lung fields.  After 4 puffs of Xopenex lungs were clear to auscultation Musculoskeletal:     Cervical back: Neck supple.  Skin:    General: Skin is warm.  Neurological:     Mental Status: He is alert and oriented to person, place, and time.  Psychiatric:        Mood and Affect: Mood normal.        Behavior: Behavior normal.        Thought Content: Thought content normal.        Judgment: Judgment normal.    Diagnostics: FVC 4.00 L, FEV1 3.23 L (79%).  Predicted FVC 5.28 L, predicted FEV1 4.07 L.  Spirometry indicates possible mild restriction.  Post bronchodilator response shows FVC 4.46 L, FEV1 3.45 L.  There is a 7% change in FEV1.  Spirometry indicates normal respiratory function.  His lungs also sound clear in all lung fields after 4 puffs of Xopenex.  Assessment and Plan: 1. Not well controlled moderate persistent asthma   2. Seasonal and perennial allergic rhinitis   3. Polyp of nasal cavity   4. Gastroesophageal reflux disease, unspecified whether esophagitis present     Meds ordered this encounter  Medications   omeprazole (PRILOSEC) 40 MG capsule    Sig: TAKE 1 CAPSULE BY MOUTH 30 MINUTES BEFORE BREAKFAST,normal    Dispense:  90 capsule    Refill:  1   montelukast (SINGULAIR) 10 MG tablet    Sig: Take 1 tablet (10 mg total) by mouth at bedtime.    Dispense:  30 tablet    Refill:  1   Fluticasone-Salmeterol,sensor, (AIRDUO DIGIHALER) 113-14 MCG/ACT AEPB    Sig: Use 1 inhalation twice a day to help prevent cough and wheeze. Rinse mouth out after to help prevent thrush.    Dispense:  1 each    Refill:  2    Patient phone number: [phone]    Patient Instructions  Moderate persistent asthma Stop Advair HFA 230/21 and or Symbicort Start AirDuo 113/ 14 mcg 1 puff twice a day to help prevent  cough and wheeze. This medication will come from a specialty pharmacy and not your local pharmacy. Call and let us know if this medication is too expensive Continue montelukast 10 mg at night to help prevent cough and wheeze May use albuterol 2 puffs every 4-6 hours as needed for cough, wheeze, tightness in chest, or shortness of breath. Make sure and use this medication as needed rather than daily  Perennial and seasonal  allergic rhinitis Continue allergy injections per protocol and have access to epinephrine autoinjector device Continue montelukast 10 mg at night Continue Xhance 2 sprays each nostril twice a day May use saline nasal spray as needed for nasal symptoms.  Use this prior to any medicated nasal sprays.  Polyp of nasal cavity Continue Xhance 2 sprays each nostril twice a day to prevent polyp recurrence  Gastroesophageal reflux disease Continue lifestyle and dietary modifications Continue omeprazole 40 mg once a day 30 minutes prior to breakfast  Please let us know if this treatment plan is not working well for you Schedule a follow-up appointment in 6 weeks or sooner if needed.   Return in about 6 weeks (around 01/13/2022), or if symptoms worsen or fail to improve.    Thank you for the opportunity to care for this patient.  Please do not hesitate to contact me with questions.  Althea Charon, FNP [facility]

## 2021-12-08 ENCOUNTER — Other Ambulatory Visit: Payer: Self-pay

## 2021-12-08 MED ORDER — AIRDUO DIGIHALER 113-14 MCG/ACT IN AEPB: INHALATION_SPRAY | RESPIRATORY_TRACT | 2 refills | Status: DC

## 2021-12-23 ENCOUNTER — Other Ambulatory Visit: Payer: Self-pay

## 2021-12-23 MED ORDER — OMEPRAZOLE 40 MG PO CPDR: DELAYED_RELEASE_CAPSULE | ORAL | 1 refills | Status: DC

## 2021-12-23 MED ORDER — MONTELUKAST SODIUM 10 MG PO TABS: ORAL_TABLET | ORAL | 1 refills | Status: DC

## 2021-12-23 NOTE — Telephone Encounter (Signed)
Sent 90 day supply of montelukast 10 mg and omeprazole 40 [facility] to [facility] [address] [place], [place], [place] ?

## 2022-08-18 ENCOUNTER — Other Ambulatory Visit: Payer: Self-pay | Admitting: *Deleted

## 2022-08-18 MED ORDER — XHANCE 93 MCG/ACT NA EXHU: 2.0000 | INHALANT_SUSPENSION | Freq: Two times a day (BID) | NASAL | 5 refills | Status: DC

## 2022-08-18 MED ORDER — LEVOCETIRIZINE DIHYDROCHLORIDE 5 MG PO TABS: 5.0000 mg | ORAL_TABLET | Freq: Every morning | ORAL | 0 refills | Status: DC

## 2022-08-18 MED ORDER — ALBUTEROL SULFATE HFA 108 (90 BASE) MCG/ACT IN AERS: 2.0000 | INHALATION_SPRAY | Freq: Four times a day (QID) | RESPIRATORY_TRACT | 0 refills | Status: DC | PRN

## 2022-08-18 MED ORDER — AIRDUO DIGIHALER 113-14 MCG/ACT IN AEPB: INHALATION_SPRAY | RESPIRATORY_TRACT | 0 refills | Status: DC

## 2022-08-18 MED ORDER — OMEPRAZOLE 40 MG PO CPDR: DELAYED_RELEASE_CAPSULE | ORAL | 0 refills | Status: DC

## 2022-08-18 NOTE — Telephone Encounter (Signed)
Pt called needing refills. Scheduled a follow up appointment and sent in refills.

## 2022-09-09 ENCOUNTER — Other Ambulatory Visit: Payer: Self-pay | Admitting: *Deleted

## 2022-09-09 MED ORDER — AIRDUO DIGIHALER 113-14 MCG/ACT IN AEPB: INHALATION_SPRAY | RESPIRATORY_TRACT | 5 refills | Status: DC

## 2022-09-14 ENCOUNTER — Ambulatory Visit: Payer: BC Managed Care – PPO | Admitting: Internal Medicine

## 2022-09-16 ENCOUNTER — Ambulatory Visit: Payer: BC Managed Care – PPO | Admitting: Internal Medicine

## 2022-09-16 VITALS — BP 138/78 | HR 82 | Temp 98.5°F | Resp 20 | Ht 72.5 in | Wt 221.0 lb

## 2022-09-16 DIAGNOSIS — J330 Polyp of nasal cavity: Secondary | ICD-10-CM

## 2022-09-16 DIAGNOSIS — K219 Gastro-esophageal reflux disease without esophagitis: Secondary | ICD-10-CM | POA: Diagnosis not present

## 2022-09-16 DIAGNOSIS — J302 Other seasonal allergic rhinitis: Secondary | ICD-10-CM

## 2022-09-16 DIAGNOSIS — J3089 Other allergic rhinitis: Secondary | ICD-10-CM | POA: Diagnosis not present

## 2022-09-16 DIAGNOSIS — J4540 Moderate persistent asthma, uncomplicated: Secondary | ICD-10-CM

## 2022-09-16 MED ORDER — AIRDUO DIGIHALER 113-14 MCG/ACT IN AEPB: INHALATION_SPRAY | RESPIRATORY_TRACT | 5 refills | Status: DC

## 2022-09-16 MED ORDER — OMEPRAZOLE 20 MG PO CPDR: 20.0000 mg | DELAYED_RELEASE_CAPSULE | Freq: Every day | ORAL | 5 refills | Status: DC

## 2022-09-16 MED ORDER — XHANCE 93 MCG/ACT NA EXHU: 2.0000 | INHALANT_SUSPENSION | Freq: Two times a day (BID) | NASAL | 5 refills | Status: DC

## 2022-09-16 NOTE — Progress Notes (Signed)
Follow Up Note  RE: Christopher Medina MRN: [id number] DOB: June 23, 1966 Date of Office Visit: 09/16/2022  Referring provider: Philis Nettle, FNP Primary care provider: Philis Nettle, FNP  Chief Complaint: Allergies and Asthma  History of Present Illness: I had the pleasure of seeing Christopher Medina for a follow up visit at the [facility] on 09/16/2022. He is a 56 y.o. male, who is being followed for moderate persistent asthma, allergic rhinitis with nasal polyposis, GERD. His previous allergy office visit was on 12/02/21 with Nehemiah Settle FNP. Today is a regular follow up visit.  History obtained from patient, chart review.  Asthma is well controlled.  Denies any cough, wheeze, dsypnea  He has not used albuterol since last.  Denies any ABX, OCS, ED, UC visits since last visit  One airduo 1 puff daily and monteukast 10mg  daily  Denies any side effects of medications  Reports sinus infections, rhinitis has dramatically improved since taking allergy injection   For polyps:   On xhance 2 sprays twice a daily  Denies any increase in hyposmia or ageusia.  No increase in nasal congestion.  Denies any side effects from medications.  GERD is well controlled with 40 mg daily.  He is interested in reducing the dose of his omeprazole.   Assessment and Plan: [name] is a 56 y.o. male with: Moderate persistent asthma without complication  Seasonal and perennial allergic rhinitis  Gastroesophageal reflux disease without esophagitis  Polyp of nasal cavity Plan: Patient Instructions  Moderate persistent asthma: very well controlled,  Breathing tests today looked okay!  We can step down care now  Start AirDuo 113/ 14 mcg 1 puff twice a day as needed for cough, wheeze, dyspnea (when star use until symptoms resolve)  Continue montelukast 10 mg at night to help prevent cough and wheeze May use albuterol 2 puffs every 4-6 hours as needed for cough, wheeze, tightness in chest, or  shortness of breath.   Perennial and seasonal allergic rhinitis Continue allergy injections per protocol and have access to epinephrine autoinjector device Continue montelukast 10 mg at night Continue Xhance 2 sprays each nostril twice a day May use saline nasal spray as needed for nasal symptoms.  Use this prior to any medicated nasal sprays.  Polyp of nasal cavity Continue Xhance 2 sprays each nostril twice a day to prevent polyp recurrence Monitor nasal congestion, sense of smell and taste as these can be signs of recurrence  Continue to avoid dairy as this can thicken the texture of mucos   Gastroesophageal reflux disease Continue lifestyle and dietary modifications Decrease  to omeprazole 20 mg once a day 30 minutes prior to breakfast  Congrats on the weight loss!!   Follow up: 6 months, if you are still doing well we can go to albuterol as needed   Thank you so much for letting me partake in your care today.  Don't hesitate to reach out if you have any additional concerns!  June, MD  [facility] [place]  No follow-ups on file.  Meds ordered this encounter  Medications   DISCONTD: omeprazole (PRILOSEC) 20 MG capsule    Sig: Take 1 capsule (20 mg total) by mouth daily.    Dispense:  30 capsule    Refill:  5   Fluticasone Propionate (XHANCE) 93 MCG/ACT EXHU    Sig: Place 2 sprays into the nose 2 (two) times daily.    Dispense:  16 mL  Refill:  5    [phone] pt cell   DISCONTD: Fluticasone-Salmeterol,sensor, (AIRDUO DIGIHALER) 113-14 MCG/ACT AEPB    Sig: Use 1 inhalation twice a day to help prevent cough and wheeze. Rinse mouth out after to help prevent thrush.    Dispense:  1 each    Refill:  5    Patient phone number: [phone]   Fluticasone-Salmeterol,sensor, (AIRDUO DIGIHALER) 113-14 MCG/ACT AEPB    Sig: Use 1 inhalation twice a day to help prevent cough and wheeze. Rinse mouth out after to help prevent thrush.     Dispense:  1 each    Refill:  5    Patient phone number: [phone]   omeprazole (PRILOSEC) 20 MG capsule    Sig: Take 1 capsule (20 mg total) by mouth daily.    Dispense:  30 capsule    Refill:  5    Lab Orders  No laboratory test(s) ordered today   Diagnostics: Spirometry:  Tracings reviewed. His effort: Good reproducible efforts. FVC: 4.54 L FEV1: 4.16 L, 103% predicted FEV1/FVC ratio: 85% Interpretation: Spirometry consistent with normal pattern.  Please see scanned spirometry results for details.   Results interpreted by myself during this encounter and discussed with patient/family.   Medication List:  Current Outpatient Medications  Medication Sig Dispense Refill   albuterol (VENTOLIN HFA) 108 (90 Base) MCG/ACT inhaler Inhale 2 puffs into the lungs every 6 (six) hours as needed for wheezing or shortness of breath. 18 g 0   ibuprofen (ADVIL,MOTRIN) 200 MG tablet Take 200 mg by mouth every 6 (six) hours as needed.     levocetirizine (XYZAL) 5 MG tablet Take 1 tablet (5 mg total) by mouth every morning. 90 tablet 0   montelukast (SINGULAIR) 10 MG tablet Take one table once daily at night for coughing or wheezing. 90 tablet 1   Spacer/Aero-Holding Chambers (AEROCHAMBER MV) inhaler Use as instructed 1 each 2   Fluticasone Propionate (XHANCE) 93 MCG/ACT EXHU Place 2 sprays into the nose 2 (two) times daily. 16 mL 5   Fluticasone-Salmeterol,sensor, (AIRDUO DIGIHALER) 113-14 MCG/ACT AEPB Use 1 inhalation twice a day to help prevent cough and wheeze. Rinse mouth out after to help prevent thrush. 1 each 5   omeprazole (PRILOSEC) 20 MG capsule Take 1 capsule (20 mg total) by mouth daily. 30 capsule 5   No current facility-administered medications for this visit.   Allergies: No Known Allergies I reviewed his past medical history, social history, family history, and environmental history and no significant changes have been reported from his previous visit.  ROS: All others  negative except as noted per HPI.   Objective: BP 138/78   Pulse 82   Temp 98.5 F (36.9 C) (Temporal)   Resp 20   Ht 6' 0.5" (1.842 m)   Wt 221 lb (100.2 kg)   SpO2 99%   BMI 29.56 kg/m  Body mass index is 29.56 kg/m. General Appearance:  Alert, cooperative, no distress, appears stated age  Head:  Normocephalic, without obvious abnormality, atraumatic  Eyes:  Conjunctiva clear, EOM's intact  Nose: Nares normal, normal mucosa, no visible anterior polyps, and septum midline  Throat: Lips, tongue normal; teeth and gums normal, normal posterior oropharynx and no tonsillar exudate  Neck: Supple, symmetrical  Lungs:   clear to auscultation bilaterally, Respirations unlabored, no coughing  Heart:  regular rate and rhythm and no murmur, Appears well perfused  Extremities: No edema  Skin: Skin color, texture, turgor normal, no rashes or lesions on visualized portions  of skin   Neurologic: No gross deficits   Previous notes and tests were reviewed. The plan was reviewed with the patient/family, and all questions/concerned were addressed.  It was my pleasure to see Gerritt today and participate in his care. Please feel free to contact me with any questions or concerns.  Sincerely,  Ferol Luz, MD  Allergy & Immunology  [facility] Office: [phone]

## 2022-09-16 NOTE — Patient Instructions (Addendum)
Moderate persistent asthma: very well controlled,  Breathing tests today looked okay!  We can step down care now  Start AirDuo 113/ 14 mcg 1 puff twice a day as needed for cough, wheeze, dyspnea (when star use until symptoms resolve)  Continue montelukast 10 mg at night to help prevent cough and wheeze May use albuterol 2 puffs every 4-6 hours as needed for cough, wheeze, tightness in chest, or shortness of breath.   Perennial and seasonal allergic rhinitis Continue allergy injections per protocol and have access to epinephrine autoinjector device Continue montelukast 10 mg at night Continue Xhance 2 sprays each nostril twice a day May use saline nasal spray as needed for nasal symptoms.  Use this prior to any medicated nasal sprays.  Polyp of nasal cavity Continue Xhance 2 sprays each nostril twice a day to prevent polyp recurrence Monitor nasal congestion, sense of smell and taste as these can be signs of recurrence  Continue to avoid dairy as this can thicken the texture of mucos   Gastroesophageal reflux disease Continue lifestyle and dietary modifications Decrease  to omeprazole 20 mg once a day 30 minutes prior to breakfast  Congrats on the weight loss!!   Follow up: 6 months, if you are still doing well we can go to albuterol as needed   Thank you so much for letting me partake in your care today.  Don't hesitate to reach out if you have any additional concerns!  Ferol Luz, MD  [facility] [place], [place]

## 2022-09-17 NOTE — Addendum Note (Signed)
Addended by: Berna Bue on: 09/17/2022 07:57 AM   Modules accepted: Orders

## 2023-01-12 ENCOUNTER — Other Ambulatory Visit: Payer: Self-pay | Admitting: Family

## 2023-01-12 ENCOUNTER — Telehealth: Payer: Self-pay | Admitting: Internal Medicine

## 2023-01-12 MED ORDER — AIRDUO DIGIHALER 113-14 MCG/ACT IN AEPB: 1.0000 | INHALATION_SPRAY | Freq: Two times a day (BID) | RESPIRATORY_TRACT | 1 refills | Status: DC

## 2023-01-12 NOTE — Telephone Encounter (Signed)
Patient called and stated he needs a refill on medication AirDuo Digihaler Fluticasone-Salmeterol,sensor, (AIRDUO [employer]) 113-14 MCG/ACT AEPB sent to  [place], [place] (Ph: [phone])

## 2023-01-12 NOTE — Telephone Encounter (Signed)
[facility] of [facility] for 3 month supply

## 2023-01-13 ENCOUNTER — Telehealth: Payer: Self-pay

## 2023-01-13 ENCOUNTER — Other Ambulatory Visit: Payer: Self-pay

## 2023-01-13 ENCOUNTER — Other Ambulatory Visit (HOSPITAL_COMMUNITY): Payer: Self-pay

## 2023-01-13 MED ORDER — MONTELUKAST SODIUM 10 MG PO TABS: ORAL_TABLET | ORAL | 1 refills | Status: DC

## 2023-01-13 NOTE — Telephone Encounter (Signed)
Patient Advocate Encounter  Prior Authorization for AirDuo Digihaler 113-14MCG/ACT aerosol powder has been approved through [employer].    KeyXT:[id number]  Effective: 01-13-2023 to 01-13-2024

## 2023-01-25 ENCOUNTER — Other Ambulatory Visit: Payer: Self-pay

## 2023-01-25 MED ORDER — MONTELUKAST SODIUM 10 MG PO TABS: ORAL_TABLET | ORAL | 1 refills | Status: DC

## 2023-02-18 ENCOUNTER — Other Ambulatory Visit (HOSPITAL_COMMUNITY): Payer: Self-pay

## 2023-02-25 LAB — EXTERNAL GENERIC LAB PROCEDURE: COLOGUARD: NEGATIVE

## 2023-02-25 LAB — COLOGUARD: COLOGUARD: NEGATIVE

## 2023-03-23 ENCOUNTER — Ambulatory Visit: Payer: BC Managed Care – PPO | Admitting: Internal Medicine

## 2023-03-23 ENCOUNTER — Other Ambulatory Visit: Payer: Self-pay

## 2023-03-23 ENCOUNTER — Encounter: Payer: Self-pay | Admitting: Internal Medicine

## 2023-03-23 VITALS — BP 122/70 | HR 64 | Temp 98.2°F | Resp 18 | Ht 73.0 in | Wt 223.0 lb

## 2023-03-23 DIAGNOSIS — J330 Polyp of nasal cavity: Secondary | ICD-10-CM | POA: Diagnosis not present

## 2023-03-23 DIAGNOSIS — J3089 Other allergic rhinitis: Secondary | ICD-10-CM

## 2023-03-23 DIAGNOSIS — J302 Other seasonal allergic rhinitis: Secondary | ICD-10-CM

## 2023-03-23 DIAGNOSIS — K219 Gastro-esophageal reflux disease without esophagitis: Secondary | ICD-10-CM | POA: Diagnosis not present

## 2023-03-23 DIAGNOSIS — J4540 Moderate persistent asthma, uncomplicated: Secondary | ICD-10-CM | POA: Diagnosis not present

## 2023-03-23 MED ORDER — MONTELUKAST SODIUM 10 MG PO TABS: ORAL_TABLET | ORAL | 1 refills | Status: DC

## 2023-03-23 MED ORDER — LEVOCETIRIZINE DIHYDROCHLORIDE 5 MG PO TABS: 5.0000 mg | ORAL_TABLET | Freq: Every morning | ORAL | 0 refills | Status: DC

## 2023-03-23 MED ORDER — OMEPRAZOLE 20 MG PO CPDR: 20.0000 mg | DELAYED_RELEASE_CAPSULE | Freq: Every day | ORAL | 5 refills | Status: DC

## 2023-03-23 MED ORDER — ALBUTEROL SULFATE HFA 108 (90 BASE) MCG/ACT IN AERS: INHALATION_SPRAY | RESPIRATORY_TRACT | 1 refills | Status: DC

## 2023-03-23 NOTE — Patient Instructions (Addendum)
Moderate persistent asthma: very well controlled,  Breathing test: looked great!  Continue AirDuo 113/ 14 mcg 1 puff twice a day as needed for cough, wheeze, dyspnea (when star use until symptoms resolve)  Continue montelukast 10 mg at night to help prevent cough and wheeze May use albuterol 2 puffs every 4-6 hours as needed for cough, wheeze, tightness in chest, or shortness of breath.   Perennial and seasonal allergic rhinitis Continue montelukast 10 mg at night Continue Xhance 2 sprays each nostril twice a day May use saline nasal spray as needed for nasal symptoms.  Use this prior to any medicated nasal sprays.  Polyp of nasal cavity Continue Xhance 2 sprays each nostril twice a day to prevent polyp recurrence Monitor nasal congestion, sense of smell and taste as these can be signs of recurrence    Gastroesophageal reflux disease Continue lifestyle and dietary modifications Continue  omeprazole 20 mg once a day 30 minutes prior to breakfast  Enjoy vacation!  Follow up: 6 months, if you are still doing well we can go to albuterol as needed   Thank you so much for letting me partake in your care today.  Don't hesitate to reach out if you have any additional concerns!  Ferol Luz, MD  [facility] [place], [place]

## 2023-03-23 NOTE — Progress Notes (Signed)
Follow Up Note  RE: Christopher Medina MRN: [id number] DOB: 1965/11/08 Date of Office Visit: 03/23/2023  Referring provider: Philis Nettle, FNP Primary care provider: Philis Nettle, FNP  Chief Complaint: No chief complaint on file.  History of Present Illness: I had the pleasure of seeing Christopher Medina for a follow up visit at the [facility] on 03/23/2023. He is a 57 y.o. male, who is being followed for moderate persistent asthma, allergic rhinitis with nasal polyposis, GERD. His previous allergy office visit was on 09/16/22 with Dr. Marlynn Perking. Today is a regular follow up visit.  History obtained from patient, chart review.  Asthma is well controlled.  Denies any cough, wheeze, dsypnea  He has not used albuterol since last.  Denies any ABX, OCS, ED, UC visits since last visit  Using  airduo 1 puff daily and monteukast 10mg  daily  Denies any side effects of medications  No sinus infection since last visit.  Previously on allergy injection,  but had to stop due to logisitisc   For polyps:   On xhance 2 sprays twice a daily  Denies any increase in hyposmia or ageusia.  No increase in nasal congestion.  Denies any side effects from medications. Has continued to have mild hyposmia since sinus surgery.   GERD is well controlled with 20 mg daily. Reduced from 40mg  at last visit.   Assessment and Plan: Christopher Medina is a 57 y.o. male with: Moderate persistent asthma without complication - Plan: Spirometry with Graph  Seasonal and perennial allergic rhinitis  Polyp of nasal cavity  Gastroesophageal reflux disease without esophagitis Plan: Patient Instructions  Moderate persistent asthma: very well controlled,  Breathing test: looked great!  Continue AirDuo 113/ 14 mcg 1 puff twice a day as needed for cough, wheeze, dyspnea (when star use until symptoms resolve)  Continue montelukast 10 mg at night to help prevent cough and wheeze May use albuterol 2 puffs every 4-6 hours as  needed for cough, wheeze, tightness in chest, or shortness of breath.   Perennial and seasonal allergic rhinitis Continue montelukast 10 mg at night Continue Xhance 2 sprays each nostril twice a day May use saline nasal spray as needed for nasal symptoms.  Use this prior to any medicated nasal sprays.  Polyp of nasal cavity Continue Xhance 2 sprays each nostril twice a day to prevent polyp recurrence Monitor nasal congestion, sense of smell and taste as these can be signs of recurrence    Gastroesophageal reflux disease Continue lifestyle and dietary modifications Continue  omeprazole 20 mg once a day 30 minutes prior to breakfast  Enjoy vacation!  Follow up: 6 months, if you are still doing well we can go to albuterol as needed   Thank you so much for letting me partake in your care today.  Don't hesitate to reach out if you have any additional concerns!  Ferol Luz, MD  [facility] [place], [place] No follow-ups on file.  Meds ordered this encounter  Medications   montelukast (SINGULAIR) 10 MG tablet    Sig: Take one table once daily at night for coughing or wheezing.    Dispense:  90 tablet    Refill:  1    Please dispense 90 day supply.   omeprazole (PRILOSEC) 20 MG capsule    Sig: Take 1 capsule (20 mg total) by mouth daily.    Dispense:  30 capsule    Refill:  5   levocetirizine (XYZAL) 5 MG tablet  Sig: Take 1 tablet (5 mg total) by mouth every morning.    Dispense:  90 tablet    Refill:  0    Please dispense 90 day supply. Appt scheduled 09/14/22.   albuterol (VENTOLIN HFA) 108 (90 Base) MCG/ACT inhaler    Sig: USE 2 INHALATIONS EVERY 6 HOURS AS NEEDED FOR WHEEZING OR SHORTNESS OF BREATH    Dispense:  18 g    Refill:  1    Lab Orders  No laboratory test(s) ordered today   Diagnostics: Spirometry:  Tracings reviewed. His effort: Good reproducible efforts. FVC: 4.67 L FEV1: 3.97 L, 99% predicted FEV1/FVC ratio: 85% Interpretation:  Spirometry consistent with normal pattern.  Please see scanned spirometry results for details.   Results interpreted by myself during this encounter and discussed with patient/family.   Medication List:  Current Outpatient Medications  Medication Sig Dispense Refill   Fluticasone Propionate (XHANCE) 93 MCG/ACT EXHU Place 2 sprays into the nose 2 (two) times daily. 16 mL 5   Fluticasone-Salmeterol,sensor, (AIRDUO DIGIHALER) 113-14 MCG/ACT AEPB Inhale 1 puff into the lungs 2 (two) times daily. 3 each 1   ibuprofen (ADVIL,MOTRIN) 200 MG tablet Take 200 mg by mouth every 6 (six) hours as needed.     Spacer/Aero-Holding Chambers (AEROCHAMBER MV) inhaler Use as instructed 1 each 2   albuterol (VENTOLIN HFA) 108 (90 Base) MCG/ACT inhaler USE 2 INHALATIONS EVERY 6 HOURS AS NEEDED FOR WHEEZING OR SHORTNESS OF BREATH 18 g 1   levocetirizine (XYZAL) 5 MG tablet Take 1 tablet (5 mg total) by mouth every morning. 90 tablet 0   montelukast (SINGULAIR) 10 MG tablet Take one table once daily at night for coughing or wheezing. 90 tablet 1   omeprazole (PRILOSEC) 20 MG capsule Take 1 capsule (20 mg total) by mouth daily. 30 capsule 5   No current facility-administered medications for this visit.   Allergies: No Known Allergies I reviewed his past medical history, social history, family history, and environmental history and no significant changes have been reported from his previous visit.  ROS: All others negative except as noted per HPI.   Objective: BP 122/70 (BP Location: Left Arm, Patient Position: Sitting, Cuff Size: Normal)   Pulse 64   Temp 98.2 F (36.8 C) (Temporal)   Resp 18   Ht 6\' 1"  (1.854 m)   Wt 223 lb (101.2 kg)   SpO2 99%   BMI 29.42 kg/m  Body mass index is 29.42 kg/m. General Appearance:  Alert, cooperative, no distress, appears stated age  Head:  Normocephalic, without obvious abnormality, atraumatic  Eyes:  Conjunctiva clear, EOM's intact  Nose: Nares normal, normal  mucosa, no visible anterior polyps, and septum midline  Throat: Lips, tongue normal; teeth and gums normal, normal posterior oropharynx and no tonsillar exudate  Neck: Supple, symmetrical  Lungs:   clear to auscultation bilaterally, Respirations unlabored, no coughing  Heart:  regular rate and rhythm and no murmur, Appears well perfused  Extremities: No edema  Skin: Skin color, texture, turgor normal, no rashes or lesions on visualized portions of skin   Neurologic: No gross deficits   Previous notes and tests were reviewed. The plan was reviewed with the patient/family, and all questions/concerned were addressed.  It was my pleasure to see Christopher Medina today and participate in his care. Please feel free to contact me with any questions or concerns.  Sincerely,  Ferol Luz, MD  Allergy & Immunology  [facility] Office: [phone]

## 2023-03-25 ENCOUNTER — Telehealth: Payer: Self-pay

## 2023-03-25 NOTE — Telephone Encounter (Signed)
[facility] sent a fax stating that airduo 113 had been discontinued alternatives are advair advise to change please

## 2023-03-29 MED ORDER — ADVAIR HFA 115-21 MCG/ACT IN AERO: 2.0000 | INHALATION_SPRAY | Freq: Two times a day (BID) | RESPIRATORY_TRACT | 5 refills | Status: DC

## 2023-03-29 NOTE — Telephone Encounter (Signed)
Sent in advair 15 in place of airduo

## 2023-03-29 NOTE — Addendum Note (Signed)
Addended by: Berna Bue on: 03/29/2023 02:12 PM   Modules accepted: Orders

## 2023-03-29 NOTE — Telephone Encounter (Signed)
Lets change him to advair HFA 2 puffs twice daily.  Thanks!

## 2023-08-04 ENCOUNTER — Other Ambulatory Visit: Payer: Self-pay | Admitting: Internal Medicine

## 2023-09-22 ENCOUNTER — Encounter: Payer: Self-pay | Admitting: Internal Medicine

## 2023-09-22 ENCOUNTER — Ambulatory Visit: Payer: BC Managed Care – PPO | Admitting: Internal Medicine

## 2023-09-22 VITALS — BP 108/66 | HR 75 | Temp 97.2°F | Resp 17 | Ht 73.5 in | Wt 223.5 lb

## 2023-09-22 DIAGNOSIS — J4541 Moderate persistent asthma with (acute) exacerbation: Secondary | ICD-10-CM | POA: Diagnosis not present

## 2023-09-22 DIAGNOSIS — J3089 Other allergic rhinitis: Secondary | ICD-10-CM | POA: Diagnosis not present

## 2023-09-22 DIAGNOSIS — J330 Polyp of nasal cavity: Secondary | ICD-10-CM

## 2023-09-22 DIAGNOSIS — J302 Other seasonal allergic rhinitis: Secondary | ICD-10-CM

## 2023-09-22 DIAGNOSIS — K219 Gastro-esophageal reflux disease without esophagitis: Secondary | ICD-10-CM

## 2023-09-22 MED ORDER — XHANCE 93 MCG/ACT NA EXHU: 2.0000 | INHALANT_SUSPENSION | Freq: Two times a day (BID) | NASAL | 5 refills | Status: DC

## 2023-09-22 MED ORDER — LEVOCETIRIZINE DIHYDROCHLORIDE 5 MG PO TABS: 5.0000 mg | ORAL_TABLET | Freq: Every morning | ORAL | 0 refills | Status: DC

## 2023-09-22 MED ORDER — PREDNISONE 10 MG PO TABS
ORAL_TABLET | ORAL | 0 refills | Status: DC
Start: 2023-09-22 — End: 2024-03-22

## 2023-09-22 MED ORDER — MONTELUKAST SODIUM 10 MG PO TABS: ORAL_TABLET | ORAL | 1 refills | Status: DC

## 2023-09-22 MED ORDER — OMEPRAZOLE 20 MG PO CPDR: 20.0000 mg | DELAYED_RELEASE_CAPSULE | Freq: Every day | ORAL | 4 refills | Status: DC

## 2023-09-22 MED ORDER — ALBUTEROL SULFATE HFA 108 (90 BASE) MCG/ACT IN AERS: INHALATION_SPRAY | RESPIRATORY_TRACT | 1 refills | Status: DC

## 2023-09-22 MED ORDER — ADVAIR HFA 115-21 MCG/ACT IN AERO: 2.0000 | INHALATION_SPRAY | Freq: Two times a day (BID) | RESPIRATORY_TRACT | 5 refills | Status: DC

## 2023-09-22 MED ORDER — METHYLPREDNISOLONE ACETATE 40 MG/ML IJ SUSP
40.0000 mg | Freq: Once | INTRAMUSCULAR | Status: AC
  Administered 2023-09-22: 40 mg via INTRAMUSCULAR

## 2023-09-22 NOTE — Progress Notes (Signed)
Follow Up Note  RE: Christopher Medina MRN: [id number] DOB: September 21, 1966 Date of Office Visit: 09/22/2023  Referring provider: Philis Nettle, FNP Primary care provider: Philis Nettle, FNP  Chief Complaint: Follow-up (Pt states he was doing well, and than ran out of his meds and started getting very congested.), Asthma, and Nasal Congestion  History of Present Illness: I had the pleasure of seeing Christopher Medina for a follow up visit at the [facility] on 09/22/2023. He is a 57 y.o. male, who is being followed for moderate persistent asthma, allergic rhinitis with nasal polyposis, GERD. His previous allergy office visit was on 03/23/23 with Dr. Marlynn Perking. Today is a regular follow up visit.  History obtained from patient, chart review. At last visit: FEV1: 3.97 L, 99% predicted  Asthma is not well controlled.  Increased  cough, wheeze, dsypnea since running out of airduo 4 weeks ago.  Price has increased to $200.00  Every other day use of albuterol.  Denies any ABX, OCS, ED, UC visits since last visit  Needs refill of montelukast, omeprazole as well.  Denies any side effects of medications  No sinus infection since last visit.  Previously on allergy injection,  but had to stop due to logistics   For polyps:   On xhance 2 sprays twice a daily.   Denies any increase in hyposmia or ageusia.  No increase in nasal congestion.  Denies any side effects from medications. Very happy with control    GERD is well controlled with 20 mg daily.   No breakthrough symptoms   Assessment and Plan: [name] is a 57 y.o. male with: Moderate persistent asthma with (acute) exacerbation - Plan: Spirometry with Graph  Polyp of nasal cavity  Seasonal and perennial allergic rhinitis  Gastroesophageal reflux disease without esophagitis Plan: Patient Instructions  Moderate persistent asthma: with acute exacebration  Breathing test: shows inflammation in your lungs which was reversible with with  albuterol/   Depo-Medrol 40 mg IM given today Starting tomorrow Prednisone 10mg  : Take 2 tablets twice a day for 3 more days, Then take 2 tablets once a day for 1 day., then take 1 tablet once a day for 1 day.   Start advair 2 puffs twice daily  Continue montelukast 10 mg at night to help prevent cough and wheeze May use albuterol 2 puffs every 4-6 hours as needed for cough, wheeze, tightness in chest, or shortness of breath.   Perennial and seasonal allergic rhinitis Continue montelukast 10 mg at night Continue Xhance 2 sprays each nostril twice a day May use saline nasal spray as needed for nasal symptoms.  Use this prior to any medicated nasal sprays.  Polyp of nasal cavity Continue Xhance 2 sprays each nostril twice a day to prevent polyp recurrence Monitor nasal congestion, sense of smell and taste as these can be signs of recurrence    Gastroesophageal reflux disease Continue lifestyle and dietary modifications Continue  omeprazole 20 mg once a day 30 minutes prior to breakfast  Follow up: 6 months  Thank you so much for letting me partake in your care today.  Don't hesitate to reach out if you have any additional concerns!  Ferol Luz, MD  [facility] [place], [place] No follow-ups on file.  Meds ordered this encounter  Medications   ADVAIR HFA 115-21 MCG/ACT inhaler    Sig: Inhale 2 puffs into the lungs 2 (two) times daily.    Dispense:  1 each  Refill:  5   albuterol (VENTOLIN HFA) 108 (90 Base) MCG/ACT inhaler    Sig: USE 2 INHALATIONS EVERY 6 HOURS AS NEEDED FOR WHEEZING OR SHORTNESS OF BREATH    Dispense:  18 g    Refill:  1   Fluticasone Propionate (XHANCE) 93 MCG/ACT EXHU    Sig: Place 2 sprays into the nose 2 (two) times daily.    Dispense:  16 mL    Refill:  5    [phone] pt cell   levocetirizine (XYZAL) 5 MG tablet    Sig: Take 1 tablet (5 mg total) by mouth every morning.    Dispense:  90 tablet    Refill:  0     Please dispense 90 day supply. Appt scheduled 09/14/22.   montelukast (SINGULAIR) 10 MG tablet    Sig: Take one table once daily at night for coughing or wheezing.    Dispense:  90 tablet    Refill:  1    Please dispense 90 day supply.   omeprazole (PRILOSEC) 20 MG capsule    Sig: Take 1 capsule (20 mg total) by mouth daily.    Dispense:  90 capsule    Refill:  4   methylPREDNISolone acetate (DEPO-MEDROL) injection 40 mg   predniSONE (DELTASONE) 10 MG tablet    Sig: Take 2 tablets twice a day for 3 more days, Then take 2 tablets once a day for 1 day., then take 1 tablet once a day for 1 day.    Dispense:  15 tablet    Refill:  0    Lab Orders  No laboratory test(s) ordered today   Diagnostics: Spirometry:  Tracings reviewed. His effort: Good reproducible efforts. FVC: 3.47 L FEV1: 2..62 L, 66% predicted FEV1/FVC ratio: 99% Interpretation: Spirometry consistent with mixed obstructive and restrictive disease.  After 4 puffs of albuterol FEV1 increased by 370 cc and 14%, FVC increased by 210 cc and 6%.  This is significant postbronchodilator response Please see scanned spirometry results for details.   Results interpreted by myself during this encounter and discussed with patient/family.   Medication List:  Current Outpatient Medications  Medication Sig Dispense Refill   ibuprofen (ADVIL,MOTRIN) 200 MG tablet Take 200 mg by mouth every 6 (six) hours as needed.     predniSONE (DELTASONE) 10 MG tablet Take 2 tablets twice a day for 3 more days, Then take 2 tablets once a day for 1 day., then take 1 tablet once a day for 1 day. 15 tablet 0   Spacer/Aero-Holding Chambers (AEROCHAMBER MV) inhaler Use as instructed 1 each 2   ADVAIR HFA 115-21 MCG/ACT inhaler Inhale 2 puffs into the lungs 2 (two) times daily. 1 each 5   albuterol (VENTOLIN HFA) 108 (90 Base) MCG/ACT inhaler USE 2 INHALATIONS EVERY 6 HOURS AS NEEDED FOR WHEEZING OR SHORTNESS OF BREATH 18 g 1   Fluticasone Propionate  (XHANCE) 93 MCG/ACT EXHU Place 2 sprays into the nose 2 (two) times daily. 16 mL 5   levocetirizine (XYZAL) 5 MG tablet Take 1 tablet (5 mg total) by mouth every morning. 90 tablet 0   montelukast (SINGULAIR) 10 MG tablet Take one table once daily at night for coughing or wheezing. 90 tablet 1   omeprazole (PRILOSEC) 20 MG capsule Take 1 capsule (20 mg total) by mouth daily. 90 capsule 4   No current facility-administered medications for this visit.   Allergies: No Known Allergies I reviewed his past medical history, social history, family history, and environmental  history and no significant changes have been reported from his previous visit.  ROS: All others negative except as noted per HPI.   Objective: BP 108/66 (BP Location: Right Arm, Patient Position: Sitting, Cuff Size: Normal)   Pulse 75   Temp (!) 97.2 F (36.2 C)   Resp 17   Ht 6' 1.5" (1.867 m)   Wt 223 lb 8 oz (101.4 kg)   SpO2 96%   BMI 29.09 kg/m  Body mass index is 29.09 kg/m. General Appearance:  Alert, cooperative, no distress, appears stated age  Head:  Normocephalic, without obvious abnormality, atraumatic  Eyes:  Conjunctiva clear, EOM's intact  Nose: Nares normal, normal mucosa, no visible anterior polyps, and septum midline  Throat: Lips, tongue normal; teeth and gums normal, normal posterior oropharynx and no tonsillar exudate  Neck: Supple, symmetrical  Lungs:   end-expiratory wheezing, Respirations unlabored, no coughing  Heart:  regular rate and rhythm and no murmur, Appears well perfused  Extremities: No edema  Skin: Skin color, texture, turgor normal, no rashes or lesions on visualized portions of skin   Neurologic: No gross deficits   Previous notes and tests were reviewed. The plan was reviewed with the patient/family, and all questions/concerned were addressed.  It was my pleasure to see Bacilio today and participate in his care. Please feel free to contact me with any questions or  concerns.  Sincerely,  Ferol Luz, MD  Allergy & Immunology  [facility] Office: [phone]

## 2023-09-22 NOTE — Patient Instructions (Addendum)
Moderate persistent asthma: with acute exacebration  Breathing test: shows inflammation in your lungs which was reversible with with albuterol/   Depo-Medrol 40 mg IM given today Starting tomorrow Prednisone 10mg  : Take 2 tablets twice a day for 3 more days, Then take 2 tablets once a day for 1 day., then take 1 tablet once a day for 1 day.   Start advair 2 puffs twice daily  Continue montelukast 10 mg at night to help prevent cough and wheeze May use albuterol 2 puffs every 4-6 hours as needed for cough, wheeze, tightness in chest, or shortness of breath.   Perennial and seasonal allergic rhinitis Continue montelukast 10 mg at night Continue Xhance 2 sprays each nostril twice a day May use saline nasal spray as needed for nasal symptoms.  Use this prior to any medicated nasal sprays.  Polyp of nasal cavity Continue Xhance 2 sprays each nostril twice a day to prevent polyp recurrence Monitor nasal congestion, sense of smell and taste as these can be signs of recurrence    Gastroesophageal reflux disease Continue lifestyle and dietary modifications Continue  omeprazole 20 mg once a day 30 minutes prior to breakfast  Follow up: 6 months  Thank you so much for letting me partake in your care today.  Don't hesitate to reach out if you have any additional concerns!  Ferol Luz, MD  [facility] [place], [place]

## 2023-09-23 ENCOUNTER — Other Ambulatory Visit: Payer: Self-pay

## 2023-09-23 MED ORDER — XHANCE 93 MCG/ACT NA EXHU: 2.0000 | INHALANT_SUSPENSION | Freq: Two times a day (BID) | NASAL | 5 refills | Status: DC

## 2023-10-01 ENCOUNTER — Other Ambulatory Visit: Payer: Self-pay

## 2023-10-01 MED ORDER — XHANCE 93 MCG/ACT NA EXHU: 2.0000 | INHALANT_SUSPENSION | Freq: Two times a day (BID) | NASAL | 5 refills | Status: DC

## 2023-10-12 ENCOUNTER — Telehealth: Payer: Self-pay

## 2023-10-12 ENCOUNTER — Other Ambulatory Visit: Payer: Self-pay

## 2023-10-12 MED ORDER — XHANCE 93 MCG/ACT NA EXHU: 2.0000 | INHALANT_SUSPENSION | Freq: Two times a day (BID) | NASAL | 5 refills | Status: DC

## 2023-10-12 NOTE — Telephone Encounter (Signed)
 Spoke with patient to let him know that his xhance was approved. Sent to [employer].

## 2023-10-12 NOTE — Telephone Encounter (Signed)
 Spoke with cheryl with [facility] additional information given and xhance has been approved till 10/11/2024.  Will notify the patient. Confirmation number is [id number] sent xhance order to [employer].

## 2023-10-12 NOTE — Telephone Encounter (Signed)
Erroneous encounter/error

## 2023-10-21 ENCOUNTER — Other Ambulatory Visit: Payer: Self-pay | Admitting: Internal Medicine

## 2024-03-13 ENCOUNTER — Telehealth: Payer: Self-pay

## 2024-03-13 ENCOUNTER — Other Ambulatory Visit (HOSPITAL_COMMUNITY): Payer: Self-pay

## 2024-03-13 NOTE — Telephone Encounter (Signed)
*  Asthma/Allergy  Pharmacy Patient Advocate Encounter  Received notification from [facility] that Prior Authorization for Advair  HFA 115-21MCG/ACT aerosol  has been APPROVED from 03/13/2024 to 03/13/2025. Ran test claim, Copay is $299.46. This test claim was processed through [facility]- copay amounts may vary at other pharmacies due to pharmacy/plan contracts, or as the patient moves through the different stages of their insurance plan.   PA #/Case ID/Reference #: [id number]

## 2024-03-22 ENCOUNTER — Ambulatory Visit: Payer: BC Managed Care – PPO | Admitting: Internal Medicine

## 2024-03-22 ENCOUNTER — Other Ambulatory Visit: Payer: Self-pay

## 2024-03-22 VITALS — BP 110/70 | HR 109 | Temp 97.9°F | Resp 20 | Ht 73.0 in | Wt 225.9 lb

## 2024-03-22 DIAGNOSIS — J4541 Moderate persistent asthma with (acute) exacerbation: Secondary | ICD-10-CM

## 2024-03-22 DIAGNOSIS — J3089 Other allergic rhinitis: Secondary | ICD-10-CM

## 2024-03-22 DIAGNOSIS — J330 Polyp of nasal cavity: Secondary | ICD-10-CM | POA: Diagnosis not present

## 2024-03-22 DIAGNOSIS — J4540 Moderate persistent asthma, uncomplicated: Secondary | ICD-10-CM | POA: Diagnosis not present

## 2024-03-22 DIAGNOSIS — K219 Gastro-esophageal reflux disease without esophagitis: Secondary | ICD-10-CM

## 2024-03-22 DIAGNOSIS — J302 Other seasonal allergic rhinitis: Secondary | ICD-10-CM

## 2024-03-22 MED ORDER — LEVALBUTEROL TARTRATE 45 MCG/ACT IN AERO
4.0000 | INHALATION_SPRAY | Freq: Once | RESPIRATORY_TRACT | Status: AC
  Administered 2024-03-22: 4 via RESPIRATORY_TRACT

## 2024-03-22 MED ORDER — MONTELUKAST SODIUM 10 MG PO TABS: ORAL_TABLET | ORAL | 1 refills | Status: DC

## 2024-03-22 MED ORDER — ALBUTEROL SULFATE HFA 108 (90 BASE) MCG/ACT IN AERS: INHALATION_SPRAY | RESPIRATORY_TRACT | 1 refills | Status: DC

## 2024-03-22 MED ORDER — LEVOCETIRIZINE DIHYDROCHLORIDE 5 MG PO TABS: 5.0000 mg | ORAL_TABLET | Freq: Every morning | ORAL | 0 refills | Status: DC

## 2024-03-22 MED ORDER — FLUTICASONE-SALMETEROL 250-50 MCG/ACT IN AEPB: 1.0000 | INHALATION_SPRAY | Freq: Two times a day (BID) | RESPIRATORY_TRACT | 5 refills | Status: DC

## 2024-03-22 MED ORDER — PREDNISONE 10 MG PO TABS: ORAL_TABLET | ORAL | 0 refills | Status: DC

## 2024-03-22 MED ORDER — FLUTICASONE PROPIONATE 50 MCG/ACT NA SUSP: 2.0000 | Freq: Every day | NASAL | 2 refills | Status: DC

## 2024-03-22 MED ORDER — DUPILUMAB 300 MG/2ML ~~LOC~~ SOSY
300.0000 mg | PREFILLED_SYRINGE | Freq: Once | SUBCUTANEOUS | Status: AC
  Administered 2024-03-22: 300 mg via SUBCUTANEOUS

## 2024-03-22 NOTE — Progress Notes (Signed)
 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  Epi-Pen:no Consent signed and patient instructions given.   Suann Elms 03/22/2024, 11:42 AM

## 2024-03-22 NOTE — Patient Instructions (Addendum)
 Moderate persistent asthma:  with acute exacerbation due to insurance pricing of all dual therapies being unaffordable  Breathing test: shows inflammation in your lungs which was reversible   Prednisone  10mg  : Take 2 tablets twice a day for 3 more days, Then take 2 tablets once a day for 1 day., then take 1 tablet once a day for 1 day.    Start Trelegy 100mcg 1 puff daily.  Samples provided   This is not my preferred choice, but insurance is now dictating medical decisions due to cost  Continue montelukast  10 mg at night to help prevent cough and wheeze May use albuterol  2 puffs every 4-6 hours as needed for cough, wheeze, tightness in chest, or shortness of breath.   Perennial and seasonal allergic rhinitis Continue montelukast  10 mg at night Start flonase  (fluticasone ) 1 spray per nostril twice daily  May use saline nasal spray as needed for nasal symptoms.  Use this prior to any medicated nasal sprays.  Polyp of nasal cavity Stop Xhance   Start flonase  (fluticasone ) 1 spray per nostril twice daily.   Can purchase cheaply off [employer] if not covered by insurance  Monitor nasal congestion, sense of smell and taste as these can be signs of recurrence  Start dupixent 300mg  [place] every 2 weeks    Initial injection given via sample today    Informed consent obtained today   Our Biologic coordinator will be reaching out to you in regards to coverage/approval    Gastroesophageal reflux disease Continue lifestyle and dietary modifications Continue  omeprazole  20 mg once a day 30 minutes prior to breakfast  Follow up: 2 months   Thank you so much for letting me partake in your care today.  Don't hesitate to reach out if you have any additional concerns!  Orelia Binet, MD  [facility] [place], [place]

## 2024-03-22 NOTE — Progress Notes (Signed)
 Follow Up Note  RE: Christopher Medina MRN: [id number] DOB: 1966-01-22 Date of Office Visit: 03/22/2024  Referring provider: Merribeth Abbott, FNP Primary care provider: Merribeth Abbott, FNP  Chief Complaint: Follow-up (6 month check in,  alternative for advair  ran out 2 covered by insurance his last one cost $295)  History of Present Illness: I had the pleasure of seeing Christopher Medina for a follow up visit at the [facility] on 03/23/2024. He is a 58 y.o. male, who is being followed for moderate persistent asthma, allergic rhinitis with nasal polyposis, GERD. His previous allergy office visit was on 03/23/23 with Dr. Jolayne Natter. Today is a regular follow up visit.  History obtained from patient, chart review. At last visit:FEV1: 2..62 L, 66% predicted, +14% post BD, treated for Asthma AE with depo medrol  and prednisone    Asthma is not well controlled.  Increased  cough, wheeze, dsypnea since running out of advair  2 weeks ago.  Price has increased to $285.00 which he cannot afford.  He is well controlled on his inhalers when they are affordable.  This is his second AE in 6 months due to running out of inhaler due to price.  Using albuterol  2-3 times a day  Denies any ABX, OCS, ED, UC visits since last visit  Needs refill of montelukast , omeprazole  as well.  Denies any side effects of medications  No sinus infection since last visit.  Previously on allergy injection,  but had to stop due to logistics   For polyps:   On xhance  2 sprays twice a daily.   Denies any increase in hyposmia or ageusia.  No increase in nasal congestion.  Denies any side effects from medications. Very happy with control    GERD is well controlled with 20 mg daily.   No breakthrough symptoms   He is open to switching to dupixent for asthma/polyp control.   Assessment and Plan: Charbel is a 58 y.o. male with: Moderate persistent asthma with (acute) exacerbation - Plan: Spirometry with Graph  Seasonal and  perennial allergic rhinitis  Polyp of nasal cavity  Gastroesophageal reflux disease without esophagitis Plan: Patient Instructions  Moderate persistent asthma:  with acute exacerbation due to insurance pricing of all dual therapies being unaffordable  Breathing test: shows inflammation in your lungs which was reversible   Prednisone  10mg  : Take 2 tablets twice a day for 3 more days, Then take 2 tablets once a day for 1 day., then take 1 tablet once a day for 1 day.    Start Trelegy 100mcg 1 puff daily.  Samples provided   This is not my preferred choice, but insurance is now dictating medical decisions due to cost  Continue montelukast  10 mg at night to help prevent cough and wheeze May use albuterol  2 puffs every 4-6 hours as needed for cough, wheeze, tightness in chest, or shortness of breath.   Perennial and seasonal allergic rhinitis Continue montelukast  10 mg at night Start flonase  (fluticasone ) 1 spray per nostril twice daily  May use saline nasal spray as needed for nasal symptoms.  Use this prior to any medicated nasal sprays.  Polyp of nasal cavity Stop Xhance   Start flonase  (fluticasone ) 1 spray per nostril twice daily.   Can purchase cheaply off amazon if not covered by insurance  Monitor nasal congestion, sense of smell and taste as these can be signs of recurrence  Start dupixent 300mg  [place] every 2 weeks    Initial injection given via sample today  Informed consent obtained today   Our Biologic coordinator will be reaching out to you in regards to coverage/approval    Gastroesophageal reflux disease Continue lifestyle and dietary modifications Continue  omeprazole  20 mg once a day 30 minutes prior to breakfast  Follow up: 2 months   Thank you so much for letting me partake in your care today.  Don't hesitate to reach out if you have any additional concerns!  Orelia Binet, MD  [facility] [place] [place] No follow-ups on file.  Meds ordered  this encounter  Medications   DISCONTD: levocetirizine (XYZAL ) 5 MG tablet    Sig: Take 1 tablet (5 mg total) by mouth every morning.    Dispense:  90 tablet    Refill:  0    Please dispense 90 day supply. Appt scheduled 09/14/22.   DISCONTD: albuterol  (VENTOLIN  HFA) 108 (90 Base) MCG/ACT inhaler    Sig: USE 2 INHALATIONS EVERY 6 HOURS AS NEEDED FOR WHEEZING OR SHORTNESS OF BREATH    Dispense:  18 g    Refill:  1   DISCONTD: montelukast  (SINGULAIR ) 10 MG tablet    Sig: Take one table once daily at night for coughing or wheezing.    Dispense:  90 tablet    Refill:  1    Please dispense 90 day supply.   levalbuterol (XOPENEX HFA) inhaler 4 puff   DISCONTD: fluticasone -salmeterol (WIXELA INHUB) 250-50 MCG/ACT AEPB    Sig: Inhale 1 puff into the lungs in the morning and at bedtime.    Dispense:  1 each    Refill:  5   DISCONTD: fluticasone  (FLONASE ) 50 MCG/ACT nasal spray    Sig: Place 2 sprays into both nostrils daily.    Dispense:  16 g    Refill:  2   dupilumab (DUPIXENT) prefilled syringe 300 mg   DISCONTD: predniSONE  (DELTASONE ) 10 MG tablet    Sig: Prednisone  10mg  : Take 2 tablets twice a day for 3 more days, Then take 2 tablets once a day for 1 day., then take 1 tablet once a day for 1 day.    Dispense:  15 tablet    Refill:  0   DISCONTD: predniSONE  (DELTASONE ) 10 MG tablet    Sig: Prednisone  10mg  : Take 2 tablets twice a day for 3 more days, Then take 2 tablets once a day for 1 day., then take 1 tablet once a day for 1 day.    Dispense:  15 tablet    Refill:  0   predniSONE  (DELTASONE ) 10 MG tablet    Sig: Prednisone  10mg  : Take 2 tablets twice a day for 3 more days, Then take 2 tablets once a day for 1 day., then take 1 tablet once a day for 1 day.    Dispense:  15 tablet    Refill:  0   DISCONTD: montelukast  (SINGULAIR ) 10 MG tablet    Sig: Take one table once daily at night for coughing or wheezing.    Dispense:  90 tablet    Refill:  1    Please dispense 90 day  supply.   DISCONTD: levocetirizine (XYZAL ) 5 MG tablet    Sig: Take 1 tablet (5 mg total) by mouth every morning.    Dispense:  90 tablet    Refill:  0    Please dispense 90 day supply. Appt scheduled 09/14/22.   fluticasone -salmeterol (WIXELA INHUB) 250-50 MCG/ACT AEPB    Sig: Inhale 1 puff into the lungs in  the morning and at bedtime.    Dispense:  1 each    Refill:  5   fluticasone  (FLONASE ) 50 MCG/ACT nasal spray    Sig: Place 2 sprays into both nostrils daily.    Dispense:  16 g    Refill:  2   DISCONTD: albuterol  (VENTOLIN  HFA) 108 (90 Base) MCG/ACT inhaler    Sig: USE 2 INHALATIONS EVERY 6 HOURS AS NEEDED FOR WHEEZING OR SHORTNESS OF BREATH    Dispense:  18 g    Refill:  1    Lab Orders  No laboratory test(s) ordered today   Diagnostics: Spirometry:  Tracings reviewed. His effort: Good reproducible efforts. FVC: 3.49 L FEV1: 2..85 L, 72% predicted FEV1/FVC ratio: 82% Interpretation: Spirometry consistent with possible restrictive disease.  After 4 puffs of albuterol  there was a significant post BD response in FVC (14%) Please see scanned spirometry results for details.   Results interpreted by myself during this encounter and discussed with patient/family.   Medication List:  Current Outpatient Medications  Medication Sig Dispense Refill   ibuprofen (ADVIL,MOTRIN) 200 MG tablet Take 200 mg by mouth every 6 (six) hours as needed.     omeprazole  (PRILOSEC) 20 MG capsule Take 1 capsule (20 mg total) by mouth daily. 90 capsule 4   Spacer/Aero-Holding Chambers (AEROCHAMBER MV) inhaler Use as instructed 1 each 2   albuterol  (VENTOLIN  HFA) 108 (90 Base) MCG/ACT inhaler USE 2 INHALATIONS EVERY 6 HOURS AS NEEDED FOR WHEEZING OR SHORTNESS OF BREATH 18 g 1   fluticasone  (FLONASE ) 50 MCG/ACT nasal spray Place 2 sprays into both nostrils daily. 16 g 2   fluticasone -salmeterol (WIXELA INHUB) 250-50 MCG/ACT AEPB Inhale 1 puff into the lungs in the morning and at bedtime. 1 each 5    Fluticasone -Umeclidin-Vilant (TRELEGY ELLIPTA) 100-62.5-25 MCG/ACT AEPB Inhale 1 puff into the lungs once for 1 dose. 1 each 5   levocetirizine (XYZAL ) 5 MG tablet Take 1 tablet (5 mg total) by mouth every morning. 90 tablet 0   montelukast  (SINGULAIR ) 10 MG tablet Take one table once daily at night for coughing or wheezing. 90 tablet 1   predniSONE  (DELTASONE ) 10 MG tablet Prednisone  10mg  : Take 2 tablets twice a day for 3 more days, Then take 2 tablets once a day for 1 day., then take 1 tablet once a day for 1 day. 15 tablet 0   No current facility-administered medications for this visit.   Allergies: No Known Allergies I reviewed his past medical history, social history, family history, and environmental history and no significant changes have been reported from his previous visit.  ROS: All others negative except as noted per HPI.   Objective: BP 110/70   Pulse (!) 109   Temp 97.9 F (36.6 C) (Temporal)   Resp 20   Ht 6' 1 (1.854 m)   Wt 225 lb 14.4 oz (102.5 kg)   SpO2 97%   BMI 29.80 kg/m  Body mass index is 29.8 kg/m. General Appearance:  Alert, cooperative, no distress, appears stated age  Head:  Normocephalic, without obvious abnormality, atraumatic  Eyes:  Conjunctiva clear, EOM's intact  Nose: Nares normal, normal mucosa, no visible anterior polyps, and septum midline  Throat: Lips, tongue normal; teeth and gums normal, normal posterior oropharynx and no tonsillar exudate  Neck: Supple, symmetrical  Lungs:   end-expiratory wheezing, Respirations unlabored, no coughing  Heart:  regular rate and rhythm and no murmur, Appears well perfused  Extremities: No edema  Skin: Skin color,  texture, turgor normal, no rashes or lesions on visualized portions of skin   Neurologic: No gross deficits   Previous notes and tests were reviewed. The plan was reviewed with the patient/family, and all questions/concerned were addressed.  It was my pleasure to see Eliaz today and  participate in his care. Please feel free to contact me with any questions or concerns.  Sincerely,  Orelia Binet, MD  Allergy & Immunology  [facility]  [place] Office: [phone]

## 2024-03-22 NOTE — Progress Notes (Signed)
 error

## 2024-03-23 ENCOUNTER — Other Ambulatory Visit: Payer: Self-pay

## 2024-03-23 ENCOUNTER — Telehealth: Payer: Self-pay | Admitting: Internal Medicine

## 2024-03-23 MED ORDER — TRELEGY ELLIPTA 100-62.5-25 MCG/ACT IN AEPB: 1.0000 | INHALATION_SPRAY | Freq: Once | RESPIRATORY_TRACT | 5 refills | Status: AC

## 2024-03-23 MED ORDER — ALBUTEROL SULFATE HFA 108 (90 BASE) MCG/ACT IN AERS: INHALATION_SPRAY | RESPIRATORY_TRACT | 1 refills | Status: DC

## 2024-03-23 MED ORDER — LEVOCETIRIZINE DIHYDROCHLORIDE 5 MG PO TABS: 5.0000 mg | ORAL_TABLET | Freq: Every morning | ORAL | 0 refills | Status: DC

## 2024-03-23 MED ORDER — MONTELUKAST SODIUM 10 MG PO TABS
ORAL_TABLET | ORAL | 1 refills | Status: DC
Start: 2024-03-23 — End: 2024-03-23

## 2024-03-23 MED ORDER — PREDNISONE 10 MG PO TABS: ORAL_TABLET | ORAL | 0 refills | Status: DC

## 2024-03-23 MED ORDER — FLUTICASONE PROPIONATE 50 MCG/ACT NA SUSP: 2.0000 | Freq: Every day | NASAL | 2 refills | Status: DC

## 2024-03-23 MED ORDER — MONTELUKAST SODIUM 10 MG PO TABS
ORAL_TABLET | ORAL | 1 refills | Status: DC
Start: 2024-03-23 — End: 2024-08-09

## 2024-03-23 MED ORDER — FLUTICASONE-SALMETEROL 250-50 MCG/ACT IN AEPB: 1.0000 | INHALATION_SPRAY | Freq: Two times a day (BID) | RESPIRATORY_TRACT | 5 refills | Status: DC

## 2024-03-23 NOTE — Telephone Encounter (Signed)
 Sundance called and stated that his medications prescribed at his appointment yesterday were sent to the wrong pharmacy. He stated they need to be sent to the [facility] at [address] in [place]. He can be reached at [phone].

## 2024-03-28 ENCOUNTER — Telehealth: Payer: Self-pay | Admitting: *Deleted

## 2024-03-28 NOTE — Telephone Encounter (Signed)
 Called patient and advised approval, copay card and submit to [facility] for DUpixent . He will admin at home. Given instructions on delivery, dosing at home

## 2024-03-28 NOTE — Telephone Encounter (Signed)
-----   Message from Orelia Binet sent at 03/22/2024 11:32 AM EDT ----- I would like to start this patient on dupixent  for nasal polyps. Previously on xhance , but stopping that medication today.  He has needed to 2 OCS in past 6 months for polyp/asthma flares

## 2024-05-11 NOTE — Telephone Encounter (Signed)
 Both myself and pharmacy has reached out to patient numerous times to advise next steps to getting on dupixent  needs to call [employer] as required by Ins but no response from patient

## 2024-05-22 ENCOUNTER — Ambulatory Visit: Admitting: Internal Medicine

## 2024-05-22 ENCOUNTER — Other Ambulatory Visit: Payer: Self-pay

## 2024-05-22 ENCOUNTER — Encounter: Payer: Self-pay | Admitting: Internal Medicine

## 2024-05-22 VITALS — BP 118/72 | HR 78 | Temp 97.9°F | Resp 18 | Ht 75.0 in | Wt 233.9 lb

## 2024-05-22 DIAGNOSIS — J330 Polyp of nasal cavity: Secondary | ICD-10-CM | POA: Diagnosis not present

## 2024-05-22 DIAGNOSIS — J3089 Other allergic rhinitis: Secondary | ICD-10-CM | POA: Diagnosis not present

## 2024-05-22 DIAGNOSIS — J302 Other seasonal allergic rhinitis: Secondary | ICD-10-CM

## 2024-05-22 DIAGNOSIS — K219 Gastro-esophageal reflux disease without esophagitis: Secondary | ICD-10-CM

## 2024-05-22 DIAGNOSIS — J4540 Moderate persistent asthma, uncomplicated: Secondary | ICD-10-CM | POA: Diagnosis not present

## 2024-05-22 NOTE — Progress Notes (Addendum)
 Follow Up Note  RE: Christopher Medina MRN: [id number] DOB: 1966-02-24 Date of Office Visit: 05/22/2024  Referring provider: Shlomo Planas, FNP Primary care provider: Shlomo Planas, FNP  Chief Complaint: Follow-up (No complaints, controlled with medication. )  History of Present Illness: I had the pleasure of seeing Christopher Medina for a follow up visit at the [facility] on 05/24/2024. He is a 58 y.o. male, who is being followed for moderate persistent asthma, allergic rhinitis with nasal polyposis, GERD. His previous allergy office visit was on 03/22/24 with Dr. Lorin. Today is a regular follow up visit.  History obtained from patient, chart review. Discussed the use of AI scribe software for clinical note transcription with the patient, who gave verbal consent to proceed.  History of Present Illness Christopher Medina is a 58 year old male with asthma and nasal polyps who presents for follow-up regarding Dupixent  treatment.  Asthma and nasal polyposis symptoms and management - Asthma and nasal polyps managed with Trelegy, Flonase , and Singulair  (montelukast ) at night - Improvement in respiratory symptoms and overall condition since starting current regimen - No mention of acute exacerbations or recent hospitalizations - No chest pain, shortness of breath, or palpitations reported - Adjusting to the chronic nature of asthma and necessity of daily medication use  Dupilumab  (dupixent ) initiation and administration - Approved for Dupixent   and co pay card but has not set up shipments yet  - Uncertain about next steps to obtain medication  - Interested in learning home administration of Dupixent  injections; wife willing to assist - Open to self-injection with auto-injector, similar to EpiPen , but new to the process  GERD is well controlled    Assessment and Plan: Christopher Medina is a 58 y.o. male with: Seasonal and perennial allergic rhinitis  Moderate persistent asthma without  complication - Plan: Spirometry with Graph  Polyp of nasal cavity  Gastroesophageal reflux disease without esophagitis Plan: Patient Instructions  Moderate persistent asthma:  improved  Breathing test: looked great!  Continue  Trelegy 100mcg 1 puff daily.   Continue montelukast  10 mg at night to help prevent cough and wheeze May use albuterol  2 puffs every asthma 4-6 hours as needed for cough, wheeze, tightness in chest, or shortness of breath.   Perennial and seasonal allergic rhinitis Continue montelukast  10 mg at night Continue flonase  (fluticasone ) 1 spray per nostril twice daily  May use saline nasal spray as needed for nasal symptoms.  Use this prior to any medicated nasal sprays.  Polyp of nasal cavity Continue flonase  (fluticasone ) 1 spray per nostril twice daily.   Can purchase cheaply off amazon if not covered by insurance  Monitor nasal congestion, sense of smell and taste as these can be signs of recurrence  Continue dupixent  300mg  [place] every 2 weeks   Call Tammy our biologic coordinator about dupixent  copay card, I will send her a message as well    Gastroesophageal reflux disease Continue lifestyle and dietary modifications Continue  omeprazole  20 mg once a day 30 minutes prior to breakfast  Follow up: 4 months   Thank you so much for letting me partake in your care today.  Don't hesitate to reach out if you have any additional concerns!  Hargis Lorin, MD  [facility] [place] [place] No follow-ups on file.  No orders of the defined types were placed in this encounter.   Lab Orders  No laboratory test(s) ordered today   Diagnostics: Spirometry:  Tracings reviewed. His effort: Good  reproducible efforts. FVC: 5.56 L FEV1: 4.86 L, 111% predicted FEV1/FVC ratio: 84% Interpretation: Spirometry consistent with normal pattern.  Please see scanned spirometry results for details.   Results interpreted by myself during this encounter and  discussed with patient/family.   Medication List:  Current Outpatient Medications  Medication Sig Dispense Refill   albuterol  (VENTOLIN  HFA) 108 (90 Base) MCG/ACT inhaler USE 2 INHALATIONS EVERY 6 HOURS AS NEEDED FOR WHEEZING OR SHORTNESS OF BREATH 18 g 1   fluticasone  (FLONASE ) 50 MCG/ACT nasal spray Place 2 sprays into both nostrils daily. 16 g 2   fluticasone -salmeterol (WIXELA INHUB) 250-50 MCG/ACT AEPB Inhale 1 puff into the lungs in the morning and at bedtime. 1 each 5   ibuprofen (ADVIL,MOTRIN) 200 MG tablet Take 200 mg by mouth every 6 (six) hours as needed.     levocetirizine (XYZAL ) 5 MG tablet Take 1 tablet (5 mg total) by mouth every morning. 90 tablet 0   montelukast  (SINGULAIR ) 10 MG tablet Take one table once daily at night for coughing or wheezing. 90 tablet 1   omeprazole  (PRILOSEC) 20 MG capsule Take 1 capsule (20 mg total) by mouth daily. 90 capsule 4   predniSONE  (DELTASONE ) 10 MG tablet Prednisone  10mg  : Take 2 tablets twice a day for 3 more days, Then take 2 tablets once a day for 1 day., then take 1 tablet once a day for 1 day. 15 tablet 0   Spacer/Aero-Holding Chambers (AEROCHAMBER MV) inhaler Use as instructed 1 each 2   No current facility-administered medications for this visit.   Allergies: No Known Allergies I reviewed his past medical history, social history, family history, and environmental history and no significant changes have been reported from his previous visit.  ROS: All others negative except as noted per HPI.   Objective: BP 118/72 (BP Location: Right Arm, Patient Position: Sitting, Cuff Size: Normal)   Pulse 78   Temp 97.9 F (36.6 C) (Temporal)   Resp 18   Ht 6' 3 (1.905 m)   Wt 233 lb 14.4 oz (106.1 kg)   SpO2 98%   BMI 29.24 kg/m  Body mass index is 29.24 kg/m. General Appearance:  Alert, cooperative, no distress, appears stated age  Head:  Normocephalic, without obvious abnormality, atraumatic  Eyes:  Conjunctiva clear, EOM's  intact  Nose: Nares normal, normal mucosa, no visible anterior polyps, and septum midline  Throat: Lips, tongue normal; teeth and gums normal, normal posterior oropharynx and no tonsillar exudate  Neck: Supple, symmetrical  Lungs:   clear to auscultation bilaterally, Respirations unlabored, no coughing  Heart:  regular rate and rhythm and no murmur, Appears well perfused  Extremities: No edema  Skin: Skin color, texture, turgor normal, no rashes or lesions on visualized portions of skin   Neurologic: No gross deficits   Previous notes and tests were reviewed. The plan was reviewed with the patient/family, and all questions/concerned were addressed.  It was my pleasure to see Jefrey today and participate in his care. Please feel free to contact me with any questions or concerns.  Sincerely,  Hargis Springer, MD  Allergy & Immunology  [facility]  [place] Office: [phone]

## 2024-05-22 NOTE — Patient Instructions (Addendum)
 Moderate persistent asthma:  improved  Breathing test: looked great!  Continue  Trelegy 100mcg 1 puff daily.   Continue montelukast  10 mg at night to help prevent cough and wheeze May use albuterol  2 puffs every asthma 4-6 hours as needed for cough, wheeze, tightness in chest, or shortness of breath.   Perennial and seasonal allergic rhinitis Continue montelukast  10 mg at night Continue flonase  (fluticasone ) 1 spray per nostril twice daily  May use saline nasal spray as needed for nasal symptoms.  Use this prior to any medicated nasal sprays.  Polyp of nasal cavity Continue flonase  (fluticasone ) 1 spray per nostril twice daily.   Can purchase cheaply off [employer] if not covered by insurance  Monitor nasal congestion, sense of smell and taste as these can be signs of recurrence  Continue dupixent  300mg  [place] every 2 weeks   Call Tammy our biologic coordinator about dupixent  copay card, I will send her a message as well    Gastroesophageal reflux disease Continue lifestyle and dietary modifications Continue  omeprazole  20 mg once a day 30 minutes prior to breakfast  Follow up: 4 months   Thank you so much for letting me partake in your care today.  Don't hesitate to reach out if you have any additional concerns!  Hargis Springer, MD  [facility] [place] [place]

## 2024-06-19 ENCOUNTER — Other Ambulatory Visit: Payer: Self-pay | Admitting: Internal Medicine

## 2024-07-27 ENCOUNTER — Other Ambulatory Visit: Payer: Self-pay | Admitting: Internal Medicine

## 2024-08-09 ENCOUNTER — Other Ambulatory Visit: Payer: Self-pay

## 2024-08-09 MED ORDER — OMEPRAZOLE 20 MG PO CPDR: 20.0000 mg | DELAYED_RELEASE_CAPSULE | Freq: Every day | ORAL | 0 refills | Status: DC

## 2024-08-09 MED ORDER — MONTELUKAST SODIUM 10 MG PO TABS: ORAL_TABLET | ORAL | 1 refills | Status: DC

## 2024-08-09 MED ORDER — LEVOCETIRIZINE DIHYDROCHLORIDE 5 MG PO TABS: 5.0000 mg | ORAL_TABLET | Freq: Every morning | ORAL | 0 refills | Status: DC

## 2024-09-26 ENCOUNTER — Ambulatory Visit: Admitting: Internal Medicine

## 2024-09-26 ENCOUNTER — Encounter: Payer: Self-pay | Admitting: Internal Medicine

## 2024-09-26 VITALS — BP 122/82 | HR 85 | Temp 98.1°F | Resp 19 | Wt 231.4 lb

## 2024-09-26 DIAGNOSIS — J3089 Other allergic rhinitis: Secondary | ICD-10-CM | POA: Diagnosis not present

## 2024-09-26 DIAGNOSIS — J302 Other seasonal allergic rhinitis: Secondary | ICD-10-CM | POA: Diagnosis not present

## 2024-09-26 DIAGNOSIS — J4540 Moderate persistent asthma, uncomplicated: Secondary | ICD-10-CM

## 2024-09-26 DIAGNOSIS — J330 Polyp of nasal cavity: Secondary | ICD-10-CM | POA: Diagnosis not present

## 2024-09-26 DIAGNOSIS — K219 Gastro-esophageal reflux disease without esophagitis: Secondary | ICD-10-CM

## 2024-09-26 MED ORDER — OMEPRAZOLE 20 MG PO CPDR: 20.0000 mg | DELAYED_RELEASE_CAPSULE | Freq: Every day | ORAL | 0 refills | Status: AC

## 2024-09-26 MED ORDER — TRELEGY ELLIPTA 200-62.5-25 MCG/ACT IN AEPB: 1.0000 | INHALATION_SPRAY | Freq: Every day | RESPIRATORY_TRACT | 5 refills | Status: AC

## 2024-09-26 MED ORDER — FLUTICASONE PROPIONATE 50 MCG/ACT NA SUSP: 2.0000 | Freq: Every day | NASAL | 1 refills | Status: AC

## 2024-09-26 MED ORDER — MONTELUKAST SODIUM 10 MG PO TABS: ORAL_TABLET | ORAL | 1 refills | Status: AC

## 2024-09-26 MED ORDER — ALBUTEROL SULFATE HFA 108 (90 BASE) MCG/ACT IN AERS: INHALATION_SPRAY | RESPIRATORY_TRACT | 1 refills | Status: AC

## 2024-09-26 MED ORDER — LEVOCETIRIZINE DIHYDROCHLORIDE 5 MG PO TABS: 5.0000 mg | ORAL_TABLET | Freq: Every morning | ORAL | 0 refills | Status: AC

## 2024-09-26 NOTE — Progress Notes (Unsigned)
 Follow Up Note  RE: Christopher Medina MRN: [id number] DOB: 02-02-1966 Date of Office Visit: 09/26/2024  Referring provider: Shlomo Planas, FNP Primary care provider: Shlomo Planas, FNP  Chief Complaint: No chief complaint on file.  History of Present Illness: I had the pleasure of seeing Christopher Medina for a follow up visit at the [facility] on 09/27/2024. He is a 58 y.o. male, who is being followed for moderate persistent asthma, allergic rhinitis with nasal polyposis, GERD. His previous allergy office visit was on 05/22/24 with Dr. Lorin. Today is a regular follow up visit.  History obtained from patient, chart review. Discussed the use of AI scribe software for clinical note transcription with the patient, who gave verbal consent to proceed.  History of Present Illness Christopher Medina is a 58 year old male with asthma and nasal polyps who presents for follow-up and management of his condition.  Asthma symptoms and control - Asthma managed with Trelegy (one puff daily), Flonase , and Singulair  - Regular inhaler use - No wheezing, coughing, or shortness of breath - Feels 'pretty fine right now' - Down to last inhaler and requests refills for all medications - Expresses concern about inhaler cost and appreciates receiving samples  Nasal polyposis - History of nasal polyps with prior surgical intervention -On Dupixent  previously on in office injections with plan to transition to home injections.  For unclear reasons patient has not started his home injection as he thought he had to finish his inhaler (?) prior to starting home injections of Dupixent .  He has been trained and his wife has been trained on the autoinjector. - Continues Flonase  for ongoing management  Gastroesophageal reflux symptoms - Reflux well-controlled with omeprazole  - No worsening of symptoms as long as medication is taken  GERD is well controlled on omeprazole  20 mg daily   Assessment and  Plan: Christopher Medina is a 58 y.o. male with: Moderate persistent asthma without complication - Plan: Spirometry with Graph  Seasonal and perennial allergic rhinitis  Polyp of nasal cavity  Gastroesophageal reflux disease without esophagitis Plan: Patient Instructions  Moderate persistent asthma:  improved  Breathing test: looked great!  Continue  Trelegy 100mcg 1 puff daily.    -samples provided  Continue montelukast  10 mg at night to help prevent cough and wheeze May use albuterol  2 puffs every asthma 4-6 hours as needed for cough, wheeze, tightness in chest, or shortness of breath.   Perennial and seasonal allergic rhinitis Continue montelukast  10 mg at night Continue flonase  (fluticasone ) 1 spray per nostril twice daily  May use saline nasal spray as needed for nasal symptoms.  Use this prior to any medicated nasal sprays.  Polyp of nasal cavity Continue flonase  (fluticasone ) 1 spray per nostril twice daily.   Can purchase cheaply off amaz[employer] if not covered by insurance  Monitor nasal congestion, sense of smell and taste as these can be signs of recurrence  Continue dupixent  300mg  Christopher Medina every 2 weeks   Start home injections tonight    Gastroesophageal reflux disease Continue lifestyle and dietary modifications Continue  omeprazole  20 mg once a day 30 minutes prior to breakfast  Follow up: 6 months   Thank you so much for letting me partake in your care today.  Don't hesitate to reach out if you have any additional concerns!  Christopher Lorin, MD  [facility] [place] [place] No follow-ups on file.  Meds ordered this encounter  Medications   albuterol  (VENTOLIN  HFA) 108 (90  Base) MCG/ACT inhaler    Sig: USE 2 INHALATIONS EVERY 6 HOURS AS NEEDED FOR WHEEZING OR SHORTNESS OF BREATH    Dispense:  17 g    Refill:  1   montelukast  (SINGULAIR ) 10 MG tablet    Sig: Take one table once daily at night for coughing or wheezing.    Dispense:  90 tablet    Refill:  1     Please dispense 90 day supply.   levocetirizine (XYZAL ) 5 MG tablet    Sig: Take 1 tablet (5 mg total) by mouth every morning.    Dispense:  90 tablet    Refill:  0    Please dispense 90 day supply. Appt scheduled 09/14/22.   omeprazole  (PRILOSEC) 20 MG capsule    Sig: Take 1 capsule (20 mg total) by mouth daily.    Dispense:  90 capsule    Refill:  0   fluticasone  (FLONASE ) 50 MCG/ACT nasal spray    Sig: Place 2 sprays into both nostrils daily.    Dispense:  16 g    Refill:  1   Fluticasone -Umeclidin-Vilant (TRELEGY ELLIPTA ) 200-62.5-25 MCG/ACT AEPB    Sig: Inhale 1 puff into the lungs daily.    Dispense:  1 each    Refill:  5    Lab Orders  No laboratory test(s) ordered today   Diagnostics: Spirometry:  Tracings reviewed. His effort: Good reproducible efforts. FVC: 5.02 L FEV1: 4.04 L, 95% predicted FEV1/FVC ratio: 80% Interpretation: Spirometry consistent with normal pattern.  Please see scanned spirometry results for details.   Results interpreted by myself during this encounter and discussed with patient/family.   Medication List:  Current Outpatient Medications  Medication Sig Dispense Refill   Fluticasone -Umeclidin-Vilant (TRELEGY ELLIPTA ) 200-62.5-25 MCG/ACT AEPB Inhale 1 puff into the lungs daily. 1 each 5   ibuprofen (ADVIL,MOTRIN) 200 MG tablet Take 200 mg by mouth every 6 (six) hours as needed.     Spacer/Aero-Holding Chambers (AEROCHAMBER MV) inhaler Use as instructed 1 each 2   albuterol  (VENTOLIN  HFA) 108 (90 Base) MCG/ACT inhaler USE 2 INHALATIONS EVERY 6 HOURS AS NEEDED FOR WHEEZING OR SHORTNESS OF BREATH 17 g 1   fluticasone  (FLONASE ) 50 MCG/ACT nasal spray Place 2 sprays into both nostrils daily. 16 g 1   levocetirizine (XYZAL ) 5 MG tablet Take 1 tablet (5 mg total) by mouth every morning. 90 tablet 0   montelukast  (SINGULAIR ) 10 MG tablet Take one table once daily at night for coughing or wheezing. 90 tablet 1   omeprazole  (PRILOSEC) 20 MG capsule Take  1 capsule (20 mg total) by mouth daily. 90 capsule 0   No current facility-administered medications for this visit.   Allergies: No Known Allergies I reviewed his past medical history, social history, family history, and environmental history and no significant changes have been reported from his previous visit.  ROS: All others negative except as noted per HPI.   Objective: BP 122/82   Pulse 85   Temp 98.1 F (36.7 C) (Temporal)   Resp 19   Wt 231 lb 6.4 oz (105 kg)   SpO2 97%   BMI 28.92 kg/m  Body mass index is 28.92 kg/m. General Appearance:  Alert, cooperative, no distress, appears stated age  Head:  Normocephalic, without obvious abnormality, atraumatic  Eyes:  Conjunctiva clear, EOM's intact  Nose: Nares normal, normal mucosa, no visible anterior polyps, and septum midline  Throat: Lips, tongue normal; teeth and gums normal, normal posterior oropharynx and no tonsillar exudate  Neck: Supple, symmetrical  Lungs:   clear to auscultation bilaterally, Respirations unlabored, no coughing  Heart:  regular rate and rhythm and no murmur, Appears well perfused  Extremities: No edema  Skin: Skin color, texture, turgor normal, no rashes or lesions on visualized portions of skin   Neurologic: No gross deficits   Previous notes and tests were reviewed. The plan was reviewed with the patient/family, and all questions/concerned were addressed.  It was my pleasure to see Christopher Medina today and participate in his care. Please feel free to contact me with any questions or concerns.  Sincerely,  Christopher Springer, MD  Allergy & Immunology  [facility]  [place] Office: [phone]

## 2024-09-26 NOTE — Patient Instructions (Addendum)
 Moderate persistent asthma:  improved  Breathing test: looked great!  Continue  Trelegy 100mcg 1 puff daily.    -samples provided  Continue montelukast  10 mg at night to help prevent cough and wheeze May use albuterol  2 puffs every asthma 4-6 hours as needed for cough, wheeze, tightness in chest, or shortness of breath.   Perennial and seasonal allergic rhinitis Continue montelukast  10 mg at night Continue flonase  (fluticasone ) 1 spray per nostril twice daily  May use saline nasal spray as needed for nasal symptoms.  Use this prior to any medicated nasal sprays.  Polyp of nasal cavity Continue flonase  (fluticasone ) 1 spray per nostril twice daily.   Can purchase cheaply off amaz[employer] if not covered by insurance  Monitor nasal congestion, sense of smell and taste as these can be signs of recurrence  Continue dupixent  300mg  [place] every 2 weeks   Start home injections tonight    Gastroesophageal reflux disease Continue lifestyle and dietary modifications Continue  omeprazole  20 mg once a day 30 minutes prior to breakfast  Follow up: 6 months   Thank you so much for letting me partake in your care today.  Don't hesitate to reach out if you have any additional concerns!  Hargis Springer, MD  [facility] [place], [place]

## 2025-03-27 ENCOUNTER — Ambulatory Visit: Admitting: Internal Medicine
